# Patient Record
Sex: Female | Born: 1985 | Race: White | Hispanic: No | Marital: Married | Smoking: Never smoker
Health system: Southern US, Community
[De-identification: ages and names within clinical notes are randomized; demographics above are authoritative.]

## PROBLEM LIST (undated history)

## (undated) DIAGNOSIS — Z8619 Personal history of other infectious and parasitic diseases: Secondary | ICD-10-CM

## (undated) HISTORY — DX: Personal history of other infectious and parasitic diseases: Z86.19

---
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
## 2005-09-15 ENCOUNTER — Other Ambulatory Visit: Admission: RE | Admit: 2005-09-15 | Discharge: 2005-09-15 | Payer: Self-pay | Admitting: Obstetrics and Gynecology

## 2006-01-01 ENCOUNTER — Emergency Department (HOSPITAL_COMMUNITY): Admission: EM | Admit: 2006-01-01 | Discharge: 2006-01-01 | Payer: Self-pay | Admitting: Family Medicine

## 2006-03-08 HISTORY — PX: CHOLECYSTECTOMY: SHX55

## 2006-05-14 ENCOUNTER — Inpatient Hospital Stay (HOSPITAL_COMMUNITY): Admission: AD | Admit: 2006-05-14 | Discharge: 2006-05-14 | Payer: Self-pay | Admitting: Obstetrics and Gynecology

## 2006-05-24 ENCOUNTER — Inpatient Hospital Stay (HOSPITAL_COMMUNITY): Admission: AD | Admit: 2006-05-24 | Discharge: 2006-05-26 | Payer: Self-pay | Admitting: Obstetrics and Gynecology

## 2006-08-12 ENCOUNTER — Encounter: Admission: RE | Admit: 2006-08-12 | Discharge: 2006-08-12 | Payer: Self-pay | Admitting: Family Medicine

## 2006-09-22 ENCOUNTER — Encounter (INDEPENDENT_AMBULATORY_CARE_PROVIDER_SITE_OTHER): Payer: Self-pay | Admitting: General Surgery

## 2006-09-22 ENCOUNTER — Ambulatory Visit (HOSPITAL_COMMUNITY): Admission: RE | Admit: 2006-09-22 | Discharge: 2006-09-22 | Payer: Self-pay | Admitting: General Surgery

## 2007-09-06 ENCOUNTER — Other Ambulatory Visit: Admission: RE | Admit: 2007-09-06 | Discharge: 2007-09-06 | Payer: Self-pay | Admitting: Obstetrics and Gynecology

## 2008-07-25 ENCOUNTER — Inpatient Hospital Stay (HOSPITAL_COMMUNITY): Admission: AD | Admit: 2008-07-25 | Discharge: 2008-07-29 | Payer: Self-pay | Admitting: Obstetrics and Gynecology

## 2008-10-28 ENCOUNTER — Other Ambulatory Visit: Admission: RE | Admit: 2008-10-28 | Discharge: 2008-10-28 | Payer: Self-pay | Admitting: Obstetrics and Gynecology

## 2009-11-05 ENCOUNTER — Other Ambulatory Visit: Admission: RE | Admit: 2009-11-05 | Discharge: 2009-11-05 | Payer: Self-pay | Admitting: Obstetrics and Gynecology

## 2009-12-23 ENCOUNTER — Encounter: Admission: RE | Admit: 2009-12-23 | Discharge: 2009-12-23 | Payer: Self-pay | Admitting: Gastroenterology

## 2010-06-16 LAB — COMPREHENSIVE METABOLIC PANEL
ALT: 13 U/L (ref 0–35)
AST: 23 U/L (ref 0–37)
AST: 28 U/L (ref 0–37)
AST: 28 U/L (ref 0–37)
Albumin: 2.1 g/dL — ABNORMAL LOW (ref 3.5–5.2)
Albumin: 2.5 g/dL — ABNORMAL LOW (ref 3.5–5.2)
Alkaline Phosphatase: 100 U/L (ref 39–117)
Alkaline Phosphatase: 105 U/L (ref 39–117)
BUN: 8 mg/dL (ref 6–23)
CO2: 20 mEq/L (ref 19–32)
CO2: 24 mEq/L (ref 19–32)
Calcium: 8.5 mg/dL (ref 8.4–10.5)
Chloride: 106 mEq/L (ref 96–112)
Chloride: 108 mEq/L (ref 96–112)
Creatinine, Ser: 0.37 mg/dL — ABNORMAL LOW (ref 0.4–1.2)
Creatinine, Ser: 0.37 mg/dL — ABNORMAL LOW (ref 0.4–1.2)
Creatinine, Ser: 0.42 mg/dL (ref 0.4–1.2)
GFR calc Af Amer: >60 mL/min (ref >60)
GFR calc Af Amer: >60 mL/min (ref >60)
GFR calc Af Amer: >60 mL/min (ref >60)
GFR calc non Af Amer: >60 mL/min (ref >60)
GFR calc non Af Amer: >60 mL/min (ref >60)
Potassium: 3.8 mEq/L (ref 3.5–5.1)
Potassium: 4.2 mEq/L (ref 3.5–5.1)
Sodium: 137 mEq/L (ref 135–145)
Total Bilirubin: 0.1 mg/dL — ABNORMAL LOW (ref 0.3–1.2)
Total Bilirubin: 0.4 mg/dL (ref 0.3–1.2)
Total Protein: 6.1 g/dL (ref 6.0–8.3)

## 2010-06-16 LAB — CBC
HCT: 31.2 % — ABNORMAL LOW (ref 36.0–46.0)
HCT: 32.2 % — ABNORMAL LOW (ref 36.0–46.0)
HCT: 36.4 % (ref 36.0–46.0)
Hemoglobin: 11.6 g/dL — ABNORMAL LOW (ref 12.0–15.0)
Hemoglobin: 12.7 g/dL (ref 12.0–15.0)
MCHC: 34.7 g/dL (ref 30.0–36.0)
MCHC: 34.9 g/dL (ref 30.0–36.0)
MCHC: 34.9 g/dL (ref 30.0–36.0)
MCHC: 35 g/dL (ref 30.0–36.0)
MCV: 91.2 fL (ref 78.0–100.0)
MCV: 91.2 fL (ref 78.0–100.0)
MCV: 91.7 fL (ref 78.0–100.0)
MCV: 93.4 fL (ref 78.0–100.0)
Platelets: 109 10*3/uL — ABNORMAL LOW (ref 150–400)
Platelets: 71 10*3/uL — ABNORMAL LOW (ref 150–400)
Platelets: 81 10*3/uL — ABNORMAL LOW (ref 150–400)
RBC: 3.34 MIL/uL — ABNORMAL LOW (ref 3.87–5.11)
RBC: 3.58 MIL/uL — ABNORMAL LOW (ref 3.87–5.11)
RBC: 3.77 MIL/uL — ABNORMAL LOW (ref 3.87–5.11)
RBC: 3.99 MIL/uL (ref 3.87–5.11)
RDW: 14.6 % (ref 11.5–15.5)
RDW: 14.7 % (ref 11.5–15.5)
RDW: 15 % (ref 11.5–15.5)
WBC: 10.2 10*3/uL (ref 4.0–10.5)
WBC: 10.3 10*3/uL (ref 4.0–10.5)
WBC: 12 10*3/uL — ABNORMAL HIGH (ref 4.0–10.5)
WBC: 13 10*3/uL — ABNORMAL HIGH (ref 4.0–10.5)
WBC: 14.6 10*3/uL — ABNORMAL HIGH (ref 4.0–10.5)

## 2010-06-16 LAB — PROTIME-INR
INR: 0.9 (ref 0.00–1.49)
Prothrombin Time: 12.4 seconds (ref 11.6–15.2)

## 2010-06-16 LAB — URIC ACID: Uric Acid, Serum: 5.8 mg/dL (ref 2.4–7.0)

## 2010-06-16 LAB — RPR: RPR Ser Ql: NONREACTIVE

## 2010-07-21 NOTE — Op Note (Signed)
NAMEBRAYLI, KLINGBEIL            ACCOUNT NO.:  [id number]   MEDICAL RECORD NO.:  [id number]          PATIENT TYPE:  AMB   LOCATION:  SDS                          FACILITY:  [facility]   PHYSICIAN:  Gabrielle Dare. Janee Morn, M.D.DATE OF BIRTH:  08-02-1985   DATE OF PROCEDURE:  09/22/2006  DATE OF DISCHARGE:                               OPERATIVE REPORT   PREOPERATIVE DIAGNOSIS:  Symptomatic cholelithiasis.   POSTOPERATIVE DIAGNOSIS:  Symptomatic cholelithiasis.   PROCEDURE:  Laparoscopic cholecystectomy with intraoperative  cholangiogram.   SURGEON:  Gabrielle Dare. Janee Morn, M.D.   ASSISTANT:  Boris Sharper.   ANESTHESIA:  General.   HISTORY OF PRESENT ILLNESS:  Ms. Mcneely is a 25 year old white female  who I evaluated in the office for symptomatic cholelithiasis.  She  presents today for elective cholecystectomy.   PROCEDURE IN DETAIL:  Informed consent was obtained.  The patient  received intravenous antibiotics.  She was brought to the operating  room.  General anesthesia was administered.  Her abdomen was prepped and  draped in a sterile fashion.  The infraumbilical region was infiltrated  with 0.25% Marcaine with epinephrine and infraumbilical incision was  made.  Subcutaneous tissues were dissected down revealing the anterior  fascia.  Anterior fascia was divided sharply and the peritoneal cavity  was entered under direct vision without difficulty.  A 0 Vicryl  pursestring suture was placed around the fascial opening and the Hasson  trocar was inserted into the abdomen.  The abdomen was insufflated with  carbon dioxide in a standard fashion.  Under direct vision, an 11-mm  epigastric and two 5-mm lateral ports were placed.  The 0.25% Marcaine  with epinephrine was used at all port sites.  The dome of the  gallbladder was retracted superomedially.  The infundibulum was  retracted inferolaterally.  Dissection began laterally and progressed  medially, easily identifying the cystic  duct and the cystic artery.  This was actually and anterior branch of the cystic artery.  It was  clipped twice proximally and once distally and divided.  Primary  dissection continued until a large window was created between the cystic  duct, the infundibulum and the liver.  Once we had excellent  visualization, a clip was placed on the infundibulocystic duct junction,  a small nick was made in the cystic duct and a red cholangiogram  catheter was inserted.  Intraoperative cholangiogram was obtained, this  demonstrated a good sized common bile duct.  There were no filling  defects and contrast flowed easily into the duodenum.  The cholangiogram  catheter was removed.  Three clips were placed proximally on the cystic  duct and it was divided.  Further dissection revealed a posterior branch  of the cystic artery.  This was dissected out, clipped twice proximally  and divided distally with cautery.  The gallbladder was taken off the  liver bed with Bovie cautery and was placed in an EndoCatch bag and  removed from abdomen via the infraumbilical port site.  The liver bed  was copiously irrigated.  Meticulous hemostasis was insured.  The clips  remained in excellent position.  The irrigation fluid  was evacuated and  it was clear.  The liver bed was rechecked and remained dry.  The Hasson  trocar was removed from the abdomen and the infraumbilical fascia was  closed by tying the 0 Vicryl pursestring suture under direct vision with  care not to trap any intra-abdominal contents.  The remainder of the  ports were removed under direct vision.  The pneumoperitoneum was  released.  All four wounds were copiously irrigated.  The skin was  closed  with a running 4-0 Vicryl subcuticular stitch.  Sponge, needle, and  instrument counts were correct.  Benzoin, Steri-Strips and sterile  dressings were applied.  The patient tolerated the procedure well  without apparent complication, and was taken to the  recovery room in  stable condition.      Gabrielle Dare Janee Morn, M.D.  Electronically Signed     BET/MEDQ  D:  09/22/2006  T:  09/22/2006  Job:  [id number]   cc:   Quita Skye. Artis Flock, M.D.

## 2010-07-24 NOTE — Discharge Summary (Signed)
[name], [name]            ACCOUNT NO.:  [id number]   MEDICAL RECORD NO.:  [id number]          PATIENT TYPE:  INP   LOCATION:  9116                          FACILITY:  [facility]   PHYSICIAN:  Zenaida Niece, M.D.DATE OF BIRTH:  1985/11/07   DATE OF ADMISSION:  05/24/2006  DATE OF DISCHARGE:  05/26/2006                               DISCHARGE SUMMARY   ADMISSION DIAGNOSIS:  Intrauterine pregnancy at 39 weeks with premature  rupture of membranes.   DISCHARGE DIAGNOSIS:  Intrauterine pregnancy at 39 weeks with premature  rupture of membranes.   PROCEDURES:  On March 18 she had a vaginal delivery.   HISTORY AND PHYSICAL:  This is a 25 year old white female gravida 1,  para 0 with an EGA of [redacted] weeks who presents with the complaint of  leaking fluid since approximately 6 o'clock on the day of admission.  She complained of a few contractions.  Prenatal care was uncomplicated.   PRENATAL LABS:  Blood type is A+ with a negative antibody screen, RPR  nonreactive, rubella immune, hepatitis B surface antigen negative,  gonorrhea and chlamydia negative, 1-hour Glucola 96, group B strep is  negative.  First trimester screen is normal and AFP is normal.   PAST MEDICAL HISTORY:  Significant for migraine headaches. Remainder of  history is essentially noncontributory.   PHYSICAL EXAM:  VITAL SIGNS:  She is afebrile with stable vital signs.  ABDOMEN:  Gravid with fundal height of 38 cm on March 3.  Fetal heart  tracing is normal.  PELVIC:  Cervix is 1+, 70, -2, vertex presentation.   HOSPITAL COURSE:  The patient was admitted and started on Pitocin.  She  received a couple doses of Stadol and then epidural.  She progressed in  active labor and reached complete, pushed well. On the evening of March  18 had a vaginal delivery of a viable female infant with Apgars of 08/09  weighed 7 pounds 8 ounces.  There was a nuchal cord x1 which was  reduced.  Placenta delivered spontaneously intact and  was sent for cord  blood collection.  She had left labial and first-degree perineal  lacerations repaired with 3-0 Vicryl with local block and estimated  blood loss was 500 mL.  Postpartum she had no significant complications.  Predelivery hemoglobin 11.8, postdelivery 8.9 and on postpartum day #2  she was felt to be stable enough for discharge home.   DISCHARGE INSTRUCTIONS:  Regular diet, pelvic rest, follow-up in 6  weeks.   DISCHARGE MEDICATIONS:  Over-the-counter ibuprofen as needed and she is  given our discharge pamphlet.     Zenaida Niece, M.D.  Electronically Signed    TDM/MEDQ  D:  05/26/2006  T:  05/26/2006  Job:  [id number]

## 2010-10-30 ENCOUNTER — Inpatient Hospital Stay (INDEPENDENT_AMBULATORY_CARE_PROVIDER_SITE_OTHER)
Admission: RE | Admit: 2010-10-30 | Discharge: 2010-10-30 | Disposition: A | Discharge status: Home / Self Care | Payer: Self-pay | Source: Ambulatory Visit | Attending: Emergency Medicine | Admitting: Emergency Medicine

## 2010-10-30 DIAGNOSIS — T148XXA Other injury of unspecified body region, initial encounter: Secondary | ICD-10-CM

## 2010-10-30 DIAGNOSIS — W460XXA Contact with hypodermic needle, initial encounter: Secondary | ICD-10-CM

## 2010-10-30 LAB — HIV RAPID SCREEN (BLD OR BODY FLD EXPOSURE): Rapid HIV Screen: NONREACTIVE

## 2010-12-21 LAB — CBC
HCT: 34.7 — ABNORMAL LOW
Hemoglobin: 11.5 — ABNORMAL LOW
MCHC: 33.1
MCV: 80.9
RBC: 4.29
WBC: 10.5

## 2011-04-01 ENCOUNTER — Other Ambulatory Visit (HOSPITAL_COMMUNITY)
Admission: RE | Admit: 2011-04-01 | Discharge: 2011-04-01 | Disposition: A | Discharge status: Home / Self Care | Payer: BC Managed Care – PPO | Source: Ambulatory Visit | Attending: Obstetrics and Gynecology | Admitting: Obstetrics and Gynecology

## 2011-04-01 ENCOUNTER — Other Ambulatory Visit: Payer: Self-pay | Admitting: Nurse Practitioner

## 2011-04-01 DIAGNOSIS — Z01419 Encounter for gynecological examination (general) (routine) without abnormal findings: Secondary | ICD-10-CM | POA: Insufficient documentation

## 2011-04-29 ENCOUNTER — Other Ambulatory Visit: Payer: Self-pay | Admitting: Physician Assistant

## 2012-04-04 ENCOUNTER — Other Ambulatory Visit: Payer: Self-pay | Admitting: Nurse Practitioner

## 2012-04-04 ENCOUNTER — Inpatient Hospital Stay (HOSPITAL_COMMUNITY): Admit: 2012-04-04 | Payer: Self-pay

## 2012-04-04 ENCOUNTER — Other Ambulatory Visit (HOSPITAL_COMMUNITY)
Admission: RE | Admit: 2012-04-04 | Payer: No Typology Code available for payment source | Source: Ambulatory Visit | Admitting: Obstetrics and Gynecology

## 2012-04-04 DIAGNOSIS — Z01419 Encounter for gynecological examination (general) (routine) without abnormal findings: Secondary | ICD-10-CM | POA: Insufficient documentation

## 2013-01-11 ENCOUNTER — Other Ambulatory Visit: Payer: Self-pay

## 2013-04-20 ENCOUNTER — Other Ambulatory Visit: Payer: Self-pay | Admitting: Obstetrics and Gynecology

## 2013-04-20 ENCOUNTER — Other Ambulatory Visit (HOSPITAL_COMMUNITY)
Admission: RE | Admit: 2013-04-20 | Discharge: 2013-04-20 | Disposition: A | Discharge status: Home / Self Care | Payer: No Typology Code available for payment source | Source: Ambulatory Visit | Attending: Obstetrics and Gynecology | Admitting: Obstetrics and Gynecology

## 2013-04-20 DIAGNOSIS — Z01419 Encounter for gynecological examination (general) (routine) without abnormal findings: Secondary | ICD-10-CM | POA: Insufficient documentation

## 2016-04-27 ENCOUNTER — Other Ambulatory Visit (HOSPITAL_COMMUNITY)
Admission: RE | Admit: 2016-04-27 | Discharge: 2016-04-27 | Disposition: A | Discharge status: Home / Self Care | Payer: 59 | Source: Ambulatory Visit | Attending: Nurse Practitioner | Admitting: Nurse Practitioner

## 2016-04-27 ENCOUNTER — Other Ambulatory Visit: Payer: Self-pay | Admitting: Nurse Practitioner

## 2016-04-27 DIAGNOSIS — Z1151 Encounter for screening for human papillomavirus (HPV): Secondary | ICD-10-CM | POA: Insufficient documentation

## 2016-04-27 DIAGNOSIS — Z01419 Encounter for gynecological examination (general) (routine) without abnormal findings: Secondary | ICD-10-CM | POA: Insufficient documentation

## 2016-04-29 LAB — CYTOLOGY - PAP
DIAGNOSIS: NEGATIVE
HPV: NOT DETECTED

## 2017-01-07 ENCOUNTER — Ambulatory Visit (INDEPENDENT_AMBULATORY_CARE_PROVIDER_SITE_OTHER): Payer: 59 | Admitting: Physician Assistant

## 2017-01-07 ENCOUNTER — Encounter: Payer: Self-pay | Admitting: Physician Assistant

## 2017-01-07 ENCOUNTER — Ambulatory Visit (INDEPENDENT_AMBULATORY_CARE_PROVIDER_SITE_OTHER): Payer: 59

## 2017-01-07 VITALS — BP 118/80 | HR 91 | Temp 98.7°F | Ht 64.5 in | Wt 205.0 lb

## 2017-01-07 DIAGNOSIS — M79642 Pain in left hand: Secondary | ICD-10-CM

## 2017-01-07 LAB — COMPREHENSIVE METABOLIC PANEL
ALK PHOS: 70 U/L (ref 39–117)
ALT: 19 U/L (ref 0–35)
AST: 14 U/L (ref 0–37)
Albumin: 4.1 g/dL (ref 3.5–5.2)
BILIRUBIN TOTAL: 0.3 mg/dL (ref 0.2–1.2)
BUN: 11 mg/dL (ref 6–23)
CALCIUM: 9.5 mg/dL (ref 8.4–10.5)
CO2: 28 mEq/L (ref 19–32)
Chloride: 104 mEq/L (ref 96–112)
Creatinine, Ser: 0.49 mg/dL (ref 0.40–1.20)
GFR: 156.1 mL/min (ref >60.00)
Glucose, Bld: 113 mg/dL — ABNORMAL HIGH (ref 70–99)
POTASSIUM: 4 meq/L (ref 3.5–5.1)
Sodium: 138 mEq/L (ref 135–145)
TOTAL PROTEIN: 6.9 g/dL (ref 6.0–8.3)

## 2017-01-07 LAB — CBC WITH DIFFERENTIAL/PLATELET
Basophils Absolute: 0 10*3/uL (ref 0.0–0.1)
Basophils Relative: 0.5 % (ref 0.0–3.0)
Eosinophils Absolute: 0.1 10*3/uL (ref 0.0–0.7)
Eosinophils Relative: 1.3 % (ref 0.0–5.0)
HEMATOCRIT: 41 % (ref 36.0–46.0)
Hemoglobin: 13.4 g/dL (ref 12.0–15.0)
LYMPHS ABS: 2.7 10*3/uL (ref 0.7–4.0)
LYMPHS PCT: 29.5 % (ref 12.0–46.0)
MCHC: 32.6 g/dL (ref 30.0–36.0)
MCV: 92.4 fl (ref 78.0–100.0)
MONOS PCT: 6.6 % (ref 3.0–12.0)
Monocytes Absolute: 0.6 10*3/uL (ref 0.1–1.0)
NEUTROS PCT: 62.1 % (ref 43.0–77.0)
Neutro Abs: 5.8 10*3/uL (ref 1.4–7.7)
Platelets: 272 10*3/uL (ref 150.0–400.0)
RBC: 4.43 Mil/uL (ref 3.87–5.11)
RDW: 13.4 % (ref 11.5–15.5)
WBC: 9.3 10*3/uL (ref 4.0–10.5)

## 2017-01-07 LAB — URIC ACID: Uric Acid, Serum: 6.3 mg/dL (ref 2.4–7.0)

## 2017-01-07 LAB — C-REACTIVE PROTEIN: CRP: 1.5 mg/dL (ref 0.5–20.0)

## 2017-01-07 LAB — SEDIMENTATION RATE: Sed Rate: 45 mm/hr — ABNORMAL HIGH (ref 0–20)

## 2017-01-07 MED ORDER — METHYLPREDNISOLONE 4 MG PO TBPK: ORAL_TABLET | ORAL | 0 refills | Status: DC

## 2017-01-07 NOTE — Patient Instructions (Signed)
It was great to meet you!  Take Medrol Dose Pack as prescribed. We will call you with your lab and xray results. Please follow-up if symptoms worsen or persist despite treatment.

## 2017-01-07 NOTE — Progress Notes (Signed)
Elizabeth [name] is a 31 y.o. female here to Establish Care.  I acted as a Neurosurgeon for [employer], PA-C Corky Mull, LPN  History of Present Illness:   Chief Complaint  Patient presents with  . Establish Care    [employer]  . left hand pain  . Edema    Left hand and knuckles started Monday    Acute Concerns: L hand swelling -- on Monday, she noticed that her L hand knuckles on her index and middle finger were swollen and painful. She denies fevers, chills, poor appetite, unintentional weight loss, cough. Does have a maternal grandmother with RA, mom with arthritis. She has not had fevers that she knows of. She is R-handed. Denies any dietary indiscretions.  No LMP recorded. Patient is not currently having periods (Reason: IUD).  Health Maintenance: Weight -- Weight: 205 lb (93 kg)   Depression screen PHQ 2/9 01/07/2017  Decreased Interest 0  Down, Depressed, Hopeless 0  PHQ - 2 Score 0    No flowsheet data found.  Other providers/specialists: Dr. Leroy Libman -- dentist  Last CPE -- 2016  Past Medical History:  Diagnosis Date  . History of chicken pox      Social History   Social History  . Marital status: Married    Spouse name: N/A  . Number of children: N/A  . Years of education: N/A   Occupational History  . Not on file.   Social History Main Topics  . Smoking status: Never Smoker  . Smokeless tobacco: Never Used  . Alcohol use Yes     Comment: drinks occassionally   . Drug use: No  . Sexual activity: Yes    Birth control/ protection: IUD   Other Topics Concern  . Not on file   Social History Narrative   Worked at [employer] for a bit, now transitioning [employer]   She is a CMA    Past Surgical History:  Procedure Laterality Date  . CHOLECYSTECTOMY  2008    Family History  Problem Relation Age of Onset  . High Cholesterol Mother   . Arthritis Maternal Grandmother        rheumatoid  . Asthma Maternal Grandmother    . Depression Maternal Grandmother   . Heart attack Maternal Grandmother   . Heart disease Maternal Grandmother   . High blood pressure Maternal Grandmother   . Hypercholesterolemia Maternal Grandmother   . Stroke Maternal Grandmother   . Heart attack Maternal Grandfather     No Known Allergies   Current Medications:   Current Outpatient Prescriptions:  .  ibuprofen (ADVIL,MOTRIN) 200 MG tablet, Take 600 mg by mouth as needed., Disp: , Rfl:  .  levonorgestrel (MIRENA) 20 MCG/24HR IUD, 1 each by Intrauterine route once. Inserted 2017, needs to be removed 2022., Disp: , Rfl:  .  methylPREDNISolone (MEDROL DOSEPAK) 4 MG TBPK tablet, 6-5-4-3-2-1-off, Disp: 21 tablet, Rfl: 0   Review of Systems:   ROS  Negative unless otherwise specified per HPI.  Vitals:   Vitals:   01/07/17 0937  BP: 118/80  Pulse: 91  Temp: 98.7 F (37.1 C)  TempSrc: Oral  SpO2: 96%  Weight: 205 lb (93 kg)  Height: 5' 4.5" (1.638 m)     Body mass index is 34.64 kg/m.  Physical Exam:   Physical Exam  Constitutional: She appears well-developed. She is cooperative.  Non-toxic appearance. She does not have a sickly appearance. She does not appear ill. No distress.  Cardiovascular: Normal  rate, regular rhythm, S1 normal, S2 normal, normal heart sounds and normal pulses.   Pulses:      Radial pulses are 2+ on the right side, and 2+ on the left side.  No LE edema  Pulmonary/Chest: Effort normal and breath sounds normal.  Musculoskeletal:  L hand with swelling, point tenderness and mild erythema to 1st and 2nd MCP joints, unable to grip 2/2 pain  Neurological: She is alert. She has normal strength. GCS eye subscore is 4. GCS verbal subscore is 5. GCS motor subscore is 6.  Normal sensation to bilateral digits  Skin: Skin is warm, dry and intact.  Psychiatric: She has a normal mood and affect. Her speech is normal and behavior is normal.  Nursing note and vitals reviewed.   CLINICAL DATA: Acute left  hand pain for 5 days. No known injury. Initial encounter.  EXAM: LEFT HAND - COMPLETE 3+ VIEW  COMPARISON: None.  FINDINGS: There is no evidence of fracture, subluxation or dislocation.  The joint spaces are unremarkable.  No evidence of arthropathy or erosive changes.  Question dorsal soft tissue swelling.  IMPRESSION: Question dorsal soft tissue swelling. No bony abnormalities.   Electronically Signed By: Harmon PierJeffrey Hu M.D. On: 01/07/2017 11:02  Assessment and Plan:    [name] was seen today for establish care, left hand pain and edema.  Diagnoses and all orders for this visit:  Left hand pain Reviewed patient with Dr. Helane RimaErica Wallace. Question if antiinflammatory vs idiopathic vs rheumatoid process. Labs ordered. Medrol dose pack per orders. Xray shows swelling but no bony abnormalities. Follow-up if symptoms worsen or persist. Further work-up and treatment based on lab results. -     CBC with Differential/Platelet -     Comprehensive metabolic panel -     C-reactive protein -     Sedimentation rate -     Rheumatoid factor -     Uric acid -     DG Hand Complete Left; Future -     Cyclic citrul peptide antibody, IgG  Other orders -     methylPREDNISolone (MEDROL DOSEPAK) 4 MG TBPK tablet; 6-5-4-3-2-1-off  . Reviewed expectations re: course of current medical issues. . Discussed self-management of symptoms. . Outlined signs and symptoms indicating need for more acute intervention. . Patient verbalized understanding and all questions were answered. . See orders for this visit as documented in the electronic medical record. . Patient received an After-Visit Summary.   CMA or LPN served as scribe during this visit. History, Physical, and Plan performed by medical provider. Documentation and orders reviewed and attested to.   Jarold MottoSamantha Alize Acy, PA-C

## 2017-01-10 ENCOUNTER — Other Ambulatory Visit: Payer: Self-pay | Admitting: Physician Assistant

## 2017-01-10 ENCOUNTER — Encounter: Payer: Self-pay | Admitting: Physician Assistant

## 2017-01-10 DIAGNOSIS — M25542 Pain in joints of left hand: Secondary | ICD-10-CM

## 2017-01-10 DIAGNOSIS — R768 Other specified abnormal immunological findings in serum: Secondary | ICD-10-CM

## 2017-01-10 LAB — CYCLIC CITRUL PEPTIDE ANTIBODY, IGG: Cyclic Citrullin Peptide Ab: <16 UNITS

## 2017-01-10 LAB — RHEUMATOID FACTOR: Rhuematoid fact SerPl-aCnc: 16 IU/mL — ABNORMAL HIGH (ref <14)

## 2017-03-16 DIAGNOSIS — M2550 Pain in unspecified joint: Secondary | ICD-10-CM | POA: Diagnosis not present

## 2017-03-23 ENCOUNTER — Ambulatory Visit (INDEPENDENT_AMBULATORY_CARE_PROVIDER_SITE_OTHER): Payer: 59 | Admitting: Family Medicine

## 2017-03-23 ENCOUNTER — Encounter: Payer: Self-pay | Admitting: Family Medicine

## 2017-03-23 VITALS — BP 118/74 | HR 93 | Temp 98.6°F | Ht 64.5 in | Wt 212.2 lb

## 2017-03-23 DIAGNOSIS — L720 Epidermal cyst: Secondary | ICD-10-CM | POA: Diagnosis not present

## 2017-03-23 DIAGNOSIS — L918 Other hypertrophic disorders of the skin: Secondary | ICD-10-CM | POA: Diagnosis not present

## 2017-03-23 NOTE — Patient Instructions (Signed)
Epidermal Cyst An epidermal cyst is a small, painless lump under your skin. It may be called an epidermal inclusion cyst or an infundibular cyst. The cyst contains a grayish-white, bad-smelling substance (keratin). It is important not to pop epidermal cysts yourself. These cysts are usually harmless (benign), but they can get infected. Symptoms of infection may include:  Redness.  Inflammation.  Tenderness.  Warmth.  Fever.  A grayish-white, bad-smelling substance draining from the cyst.  Pus draining from the cyst.  Follow these instructions at home:  Take over-the-counter and prescription medicines only as told by your doctor.  If you were prescribed an antibiotic, use it as told by your doctor. Do not stop using the antibiotic even if you start to feel better.  Keep the area around your cyst clean and dry.  Wear loose, dry clothing.  Do not try to pop your cyst.  Avoid touching your cyst.  Check your cyst every day for signs of infection.  Keep all follow-up visits as told by your doctor. This is important. How is this prevented?  Wear clean, dry, clothing.  Avoid wearing tight clothing.  Keep your skin clean and dry. Shower or take baths every day.  Wash your body with a benzoyl peroxide wash when you shower or bathe. Contact a health care provider if:  Your cyst has symptoms of infection.  Your condition is not improving or is getting worse.  You have a cyst that looks different from other cysts you have had.  You have a fever. Get help right away if:  Redness spreads from the cyst into the surrounding area. This information is not intended to replace advice given to you by your health care provider. Make sure you discuss any questions you have with your health care provider. Document Released: 04/01/2004 Document Revised: 10/22/2015 Document Reviewed: 12/25/2014 Elsevier Interactive Patient Education  2018 Elsevier Inc.  

## 2017-03-23 NOTE — Progress Notes (Signed)
    Subjective:  Elizabeth [name] is a 32 y.o. female who presents today for same-day appointment with a chief complaint of skin lesions.   HPI:  Skin Lesions, New problem Located around the right eye.  First lesion appeared about a month ago. Over the last couple of weeks, she has noticed more lesions appearing.  No pain.  No drainage.  No fever or chills.  No home treatment tried.  No obvious precipitating events.  ROS: Per HPI   Objective:  Physical Exam: BP 118/74 (BP Location: Left Arm, Patient Position: Sitting, Cuff Size: Normal)   Pulse 93   Temp 98.6 F (37 C) (Oral)   Ht 5' 4.5" (1.638 m)   Wt 212 lb 3.2 oz (96.3 kg)   SpO2 98%   BMI 35.86 kg/m   Skin: -Right eye: Small 1-2 mm in diameter cystic lesions along lower eyelid, nasal corner of eye, and temporal corner of eye.  No surrounding erythema. -Right temple: Small skin tag noted.   Assessment/Plan:  Epidermal cyst Lesion like most consistent with epidermal cyst.  Reassured patient.  No hyperpigmentation, keratinization, or other atypical features.  Advised patient that these would need to be surgically removed.  She deferred referral for this for today.  Discussed warning signs including development of hyperpigmentation, keratinization, or rapidly enlarging lesions.  Follow-up as needed.  Katina Degreealeb M. Jimmey RalphParker, MD 03/23/2017 10:42 AM

## 2017-05-26 DIAGNOSIS — E669 Obesity, unspecified: Secondary | ICD-10-CM | POA: Diagnosis not present

## 2017-05-26 DIAGNOSIS — R6882 Decreased libido: Secondary | ICD-10-CM | POA: Diagnosis not present

## 2017-06-23 DIAGNOSIS — E559 Vitamin D deficiency, unspecified: Secondary | ICD-10-CM | POA: Diagnosis not present

## 2017-07-23 DIAGNOSIS — R509 Fever, unspecified: Secondary | ICD-10-CM | POA: Diagnosis not present

## 2017-07-25 ENCOUNTER — Encounter: Payer: Self-pay | Admitting: Physician Assistant

## 2017-07-25 ENCOUNTER — Telehealth: Payer: Self-pay | Admitting: Physician Assistant

## 2017-07-25 ENCOUNTER — Ambulatory Visit (INDEPENDENT_AMBULATORY_CARE_PROVIDER_SITE_OTHER): Payer: 59 | Admitting: Physician Assistant

## 2017-07-25 VITALS — BP 100/70 | HR 108 | Temp 100.0°F | Ht 64.5 in | Wt 210.4 lb

## 2017-07-25 DIAGNOSIS — R51 Headache: Secondary | ICD-10-CM | POA: Diagnosis not present

## 2017-07-25 DIAGNOSIS — R509 Fever, unspecified: Secondary | ICD-10-CM | POA: Diagnosis not present

## 2017-07-25 DIAGNOSIS — R519 Headache, unspecified: Secondary | ICD-10-CM

## 2017-07-25 LAB — POCT URINALYSIS DIPSTICK
BILIRUBIN UA: NEGATIVE
Blood, UA: NEGATIVE
GLUCOSE UA: NEGATIVE
KETONES UA: NEGATIVE
Leukocytes, UA: NEGATIVE
NITRITE UA: NEGATIVE
PROTEIN UA: NEGATIVE
Urobilinogen, UA: 1 E.U./dL
pH, UA: 5.5 (ref 5.0–8.0)

## 2017-07-25 LAB — COMPREHENSIVE METABOLIC PANEL
ALT: 33 U/L (ref 0–35)
AST: 23 U/L (ref 0–37)
Albumin: 3.9 g/dL (ref 3.5–5.2)
Alkaline Phosphatase: 54 U/L (ref 39–117)
BILIRUBIN TOTAL: 0.2 mg/dL (ref 0.2–1.2)
BUN: 10 mg/dL (ref 6–23)
CALCIUM: 8.9 mg/dL (ref 8.4–10.5)
CHLORIDE: 103 meq/L (ref 96–112)
CO2: 26 meq/L (ref 19–32)
Creatinine, Ser: 0.61 mg/dL (ref 0.40–1.20)
GFR: 120.81 mL/min (ref >60.00)
GLUCOSE: 84 mg/dL (ref 70–99)
Potassium: 3.5 mEq/L (ref 3.5–5.1)
Sodium: 138 mEq/L (ref 135–145)
Total Protein: 7.2 g/dL (ref 6.0–8.3)

## 2017-07-25 LAB — CBC
HEMATOCRIT: 39 % (ref 36.0–46.0)
HEMOGLOBIN: 13.3 g/dL (ref 12.0–15.0)
MCHC: 34.1 g/dL (ref 30.0–36.0)
MCV: 89.1 fl (ref 78.0–100.0)
PLATELETS: 209 10*3/uL (ref 150.0–400.0)
RBC: 4.38 Mil/uL (ref 3.87–5.11)
RDW: 13.5 % (ref 11.5–15.5)
WBC: 3.4 10*3/uL — ABNORMAL LOW (ref 4.0–10.5)

## 2017-07-25 LAB — POCT URINE PREGNANCY: PREG TEST UR: NEGATIVE

## 2017-07-25 MED ORDER — DOXYCYCLINE HYCLATE 100 MG PO TABS: 100.0000 mg | ORAL_TABLET | Freq: Two times a day (BID) | ORAL | 0 refills | Status: DC

## 2017-07-25 MED ORDER — KETOROLAC TROMETHAMINE 60 MG/2ML IM SOLN
60.0000 mg | Freq: Once | INTRAMUSCULAR | Status: AC
  Administered 2017-07-25: 60 mg via INTRAMUSCULAR

## 2017-07-25 NOTE — Patient Instructions (Addendum)
If your symptoms worsen, please let us know. Start the antibiotic today.  Please push fluids, urine should be clear.  We will be in touch with your lab results.

## 2017-07-25 NOTE — Telephone Encounter (Signed)
See note

## 2017-07-25 NOTE — Progress Notes (Addendum)
Elizabeth [name] is a 32 y.o. female here for a new problem.  I acted as a Neurosurgeon for [employer], PA-C Corky Mull, LPN  History of Present Illness:   Chief Complaint  Patient presents with  . Headache    Headache   This is a new problem. Episode onset: Started on Friday around lunch time. The problem occurs constantly. The pain is located in the occipital (whole head) region. The pain quality is not similar to prior headaches. The quality of the pain is described as aching (Pressure always there). The pain is at a severity of 7/10. The pain is moderate. Associated symptoms include back pain (from lying in bed all weekend), eye pain (when movng eyes left to right), a fever (Friday night 101, Saturday again 101, Sunday no fever), neck pain (due to being in bed all weekend) and scalp tenderness. Pertinent negatives include no coughing, dizziness, nausea, photophobia, sinus pressure, sore throat, swollen glands, tinnitus, visual change or vomiting. Associated symptoms comments: Fatigue. Nothing aggravates the symptoms. Treatments tried: Ibuprofen 600 mg every 6 hours none today. The treatment provided mild (Helped with fever but never took headache away) relief. Her past medical history is significant for migraine headaches (when younger).   Worst symptom is headache. 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   No LMP recorded. (Menstrual status: IUD).  No urinary symptoms. Used to get headaches and migraines, but none recently. Denies travel or tick bites. Denies cough, throat pain or ear pain.  She went to the urgent care on Saturday - mono test was negative. CBC and CMP were essentially normal, except for elevated liver enzymes.   Past Medical History:  Diagnosis Date  . History of chicken pox      Social History   Socioeconomic History  . Marital status: Married    Spouse name: Not on file  . Number of children: Not on file  . Years of  education: Not on file  . Highest education level: Not on file  Occupational History  . Not on file  Social Needs  . Financial resource strain: Not on file  . Food insecurity:    Worry: Not on file    Inability: Not on file  . Transportation needs:    Medical: Not on file    Non-medical: Not on file  Tobacco Use  . Smoking status: Never Smoker  . Smokeless tobacco: Never Used  Substance and Sexual Activity  . Alcohol use: Yes    Comment: drinks occassionally   . Drug use: No  . Sexual activity: Yes    Birth control/protection: IUD  Lifestyle  . Physical activity:    Days per week: Not on file    Minutes per session: Not on file  . Stress: Not on file  Relationships  . Social connections:    Talks on phone: Not on file    Gets together: Not on file    Attends religious service: Not on file    Active member of club or organization: Not on file    Attends meetings of clubs or organizations: Not on file    Relationship status: Not on file  . Intimate partner violence:    Fear of current or ex partner: Not on file    Emotionally abused: Not on file    Physically abused: Not on file    Forced sexual activity: Not on file  Other Topics Concern  . Not on file  Social History Narrative   Worked at [employer] for a bit, now transitioning [employer]   She is a CMA    Past Surgical History:  Procedure Laterality Date  . CHOLECYSTECTOMY  2008    Family History  Problem Relation Age of Onset  . High Cholesterol Mother   . Arthritis Maternal Grandmother        rheumatoid  . Asthma Maternal Grandmother   . Depression Maternal Grandmother   . Heart attack Maternal Grandmother   . Heart disease Maternal Grandmother   . High blood pressure Maternal Grandmother   . Hypercholesterolemia Maternal Grandmother   . Stroke Maternal Grandmother   . Heart attack Maternal Grandfather     No Known Allergies  Current Medications:   Current Outpatient  Medications:  .  ibuprofen (ADVIL,MOTRIN) 200 MG tablet, Take 600 mg by mouth every 6 (six) hours as needed., Disp: , Rfl:  .  doxycycline (VIBRA-TABS) 100 MG tablet, Take 1 tablet (100 mg total) by mouth 2 (two) times daily., Disp: 20 tablet, Rfl: 0 .  levonorgestrel (MIRENA) 20 MCG/24HR IUD, 1 each by Intrauterine route once. Inserted 2017, needs to be removed 2022., Disp: , Rfl:    Review of Systems:   Review of Systems  Constitutional: Positive for fever (Friday night 101, Saturday again 101, Sunday no fever).  HENT: Negative for sinus pressure, sore throat and tinnitus.   Eyes: Positive for pain (when movng eyes left to right). Negative for photophobia.  Respiratory: Negative for cough.   Gastrointestinal: Negative for nausea and vomiting.  Musculoskeletal: Positive for back pain (from lying in bed all weekend) and neck pain (due to being in bed all weekend).  Neurological: Positive for headaches. Negative for dizziness.    Vitals:   Vitals:   07/25/17 0851  BP: 100/70  Pulse: (!) 108  Temp: 100 F (37.8 C)  TempSrc: Oral  SpO2: 96%  Weight: 210 lb 6.1 oz (95.4 kg)  Height: 5' 4.5" (1.638 m)     Body mass index is 35.55 kg/m.  Physical Exam:   Physical Exam  Constitutional: She appears well-developed. She is cooperative.  Non-toxic appearance. She does not have a sickly appearance. She does not appear ill. No distress.  HENT:  Head: Normocephalic and atraumatic.  Right Ear: Tympanic membrane, external ear and ear canal normal. Tympanic membrane is not erythematous, not retracted and not bulging.  Left Ear: Tympanic membrane, external ear and ear canal normal. Tympanic membrane is not erythematous, not retracted and not bulging.  Nose: Nose normal. Right sinus exhibits no maxillary sinus tenderness and no frontal sinus tenderness. Left sinus exhibits no maxillary sinus tenderness and no frontal sinus tenderness.  Mouth/Throat: Uvula is midline. No posterior oropharyngeal  edema or posterior oropharyngeal erythema.  Eyes: Conjunctivae and lids are normal.  Neck: Trachea normal.  Cardiovascular: Regular rhythm, S1 normal, S2 normal and normal heart sounds. Tachycardia present.  Pulmonary/Chest: Effort normal and breath sounds normal. She has no decreased breath sounds. She has no wheezes. She has no rhonchi. She has no rales.  Abdominal: Normal appearance. There is no CVA tenderness.  Musculoskeletal:  ROM of neck normal  Lymphadenopathy:    She has no cervical adenopathy.  Neurological: She is alert. She has normal strength. No cranial nerve deficit or sensory deficit. GCS eye subscore is 4. GCS verbal subscore is 5. GCS motor subscore is 6.  Skin: Skin is warm, dry and intact.  Psychiatric: She has a normal mood and affect.  Her speech is normal and behavior is normal.  Nursing note and vitals reviewed.  Results for orders placed or performed in visit on 07/25/17  POCT urine pregnancy  Result Value Ref Range   Preg Test, Ur Negative Negative  POCT urinalysis dipstick  Result Value Ref Range   Color, UA Yellow    Clarity, UA Slightly Cloudy    Glucose, UA Negative    Bilirubin, UA Negative    Ketones, UA Negative    Spec Grav, UA >=1.030 (A) 1.010 - 1.025   Blood, UA Negative    pH, UA 5.5 5.0 - 8.0   Protein, UA Negative    Urobilinogen, UA 1.0 0.2 or 1.0 E.U./dL   Nitrite, UA Negative    Leukocytes, UA Negative Negative   Appearance     Odor       Assessment and Plan:    Lamanda was seen today for headache.  Diagnoses and all orders for this visit:  Fever, unspecified fever cause and Acute nonintractable headache, unspecified headache type Negative urine pregnancy. Neuro exam benign. No red flags on exam. UA shows dehydration but essentially normal otherwise. Discussed with Dr. Helane Rima. Will repeat blood work today (CBC and CMP). Suspect HA and likely viral illness. Treated with Toradol injection in office today. Given ongoing  symptoms and lack of improvement despite supportive care, will empirically treat with Doxycycline at this time. If symptoms worsen or persist despite treatment, recommend follow-up. -     POCT urinalysis dipstick -     CBC -     Comprehensive metabolic panel -     POCT urine pregnancy -     ketorolac (TORADOL) injection 60 mg  Other orders -     doxycycline (VIBRA-TABS) 100 MG tablet; Take 1 tablet (100 mg total) by mouth 2 (two) times daily.    . Reviewed expectations re: course of current medical issues. . Discussed self-management of symptoms. . Outlined signs and symptoms indicating need for more acute intervention. . Patient verbalized understanding and all questions were answered. . See orders for this visit as documented in the electronic medical record. . Patient received an After-Visit Summary.  CMA or LPN served as scribe during this visit. History, Physical, and Plan performed by medical provider. Documentation and orders reviewed and attested to.  Jarold Motto, PA-C

## 2017-07-25 NOTE — Telephone Encounter (Signed)
Elizabeth [name] spoke to pharmacy and was taken care of.

## 2017-07-25 NOTE — Telephone Encounter (Signed)
Copied from CRM [id number]. Topic: Quick Communication - See Telephone Encounter >> Jul 25, 2017 10:08 AM Eston Mould B wrote: CRM for notification. See Telephone encounter for: 07/25/17.  Ben from [employer] is asking if it is ok to switch out the pts doxycycline (VIBRA-TABS) 100 MG tablet    to monohydrate   His contact number is   [phone],  pt is currently at pharmacy waiting.

## 2017-07-29 ENCOUNTER — Telehealth: Payer: Self-pay

## 2017-07-29 NOTE — Telephone Encounter (Signed)
Received [facility] Triage note regarding red, splotchy, itching rash on her feet only that developed on 07/28/2017.  Patient saw Jarold Motto and has been taking doxycycline since Monday, 07/25/2017, and was concerned the rash may have been related to the medication.  Rash localized to feet only.  Rash is flat and not raised.  [facility] advised patient to be seen within 24 hours.  Spoke with Jarold Motto, PA, and she recommended patient come in to be seen for appointment today.  Attempted to contact patient, but patient did not answer.  Left voicemail asking patient to return call to office if she would like to come in for an appointment to address the rash.  Jarold Motto will not be available, but other providers in the office have openings on their schedule and will be able to see her.

## 2017-08-25 DIAGNOSIS — R6882 Decreased libido: Secondary | ICD-10-CM | POA: Diagnosis not present

## 2017-11-10 DIAGNOSIS — E669 Obesity, unspecified: Secondary | ICD-10-CM | POA: Diagnosis not present

## 2017-11-10 DIAGNOSIS — R6882 Decreased libido: Secondary | ICD-10-CM | POA: Diagnosis not present

## 2017-12-20 ENCOUNTER — Ambulatory Visit (INDEPENDENT_AMBULATORY_CARE_PROVIDER_SITE_OTHER): Payer: 59 | Admitting: Physician Assistant

## 2017-12-20 ENCOUNTER — Encounter: Payer: Self-pay | Admitting: Physician Assistant

## 2017-12-20 VITALS — BP 122/84 | HR 78 | Temp 98.0°F | Resp 16 | Ht 64.5 in | Wt 215.6 lb

## 2017-12-20 DIAGNOSIS — R05 Cough: Secondary | ICD-10-CM

## 2017-12-20 DIAGNOSIS — R059 Cough, unspecified: Secondary | ICD-10-CM

## 2017-12-20 MED ORDER — PREDNISONE 20 MG PO TABS: 20.0000 mg | ORAL_TABLET | Freq: Every day | ORAL | 0 refills | Status: DC

## 2017-12-20 MED ORDER — BENZONATATE 100 MG PO CAPS: 100.0000 mg | ORAL_CAPSULE | Freq: Two times a day (BID) | ORAL | 0 refills | Status: DC | PRN

## 2017-12-20 MED ORDER — AMOXICILLIN-POT CLAVULANATE 875-125 MG PO TABS: 1.0000 | ORAL_TABLET | Freq: Two times a day (BID) | ORAL | 0 refills | Status: DC

## 2017-12-20 NOTE — Progress Notes (Signed)
Elizabeth [name] is a 32 y.o. female here for a new problem.  History of Present Illness:   Chief Complaint  Patient presents with  . Cough    mucous, green   . Headache  . Sore Throat    Saturday    HPI   She developed mucousy green cough with sinus pressure and HA over the weekend. Symptoms are getting worse with time. Denies fever. 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 She is eating and drinking okay. She is trying to push fluids. Denies diarrhea or nausea.   Past Medical History:  Diagnosis Date  . History of chicken pox      Social History   Socioeconomic History  . Marital status: Married    Spouse name: Not on file  . Number of children: Not on file  . Years of education: Not on file  . Highest education level: Not on file  Occupational History  . Not on file  Social Needs  . Financial resource strain: Not on file  . Food insecurity:    Worry: Not on file    Inability: Not on file  . Transportation needs:    Medical: Not on file    Non-medical: Not on file  Tobacco Use  . Smoking status: Never Smoker  . Smokeless tobacco: Never Used  Substance and Sexual Activity  . Alcohol use: Yes    Comment: drinks occassionally   . Drug use: No  . Sexual activity: Yes    Birth control/protection: IUD  Lifestyle  . Physical activity:    Days per week: Not on file    Minutes per session: Not on file  . Stress: Not on file  Relationships  . Social connections:    Talks on phone: Not on file    Gets together: Not on file    Attends religious service: Not on file    Active member of club or organization: Not on file    Attends meetings of clubs or organizations: Not on file    Relationship status: Not on file  . Intimate partner violence:    Fear of current or ex partner: Not on file    Emotionally abused: Not on file    Physically abused: Not on file    Forced sexual activity: Not on file  Other Topics Concern  .  Not on file  Social History Narrative   Worked at [employer] for a bit, now transitioning [employer]   She is a [employer]    Past Surgical History:  Procedure Laterality Date  . CHOLECYSTECTOMY  2008    Family History  Problem Relation Age of Onset  . High Cholesterol Mother   . Arthritis Maternal Grandmother        rheumatoid  . Asthma Maternal Grandmother   . Depression Maternal Grandmother   . Heart attack Maternal Grandmother   . Heart disease Maternal Grandmother   . High blood pressure Maternal Grandmother   . Hypercholesterolemia Maternal Grandmother   . Stroke Maternal Grandmother   . Heart attack Maternal Grandfather     No Known Allergies  Current Medications:   Current Outpatient Medications:  .  ibuprofen (ADVIL,MOTRIN) 200 MG tablet, Take 600 mg by mouth every 6 (six) hours as needed., Disp: , Rfl:  .  levonorgestrel (MIRENA) 20 MCG/24HR IUD, 1 each by Intrauterine route once. Inserted 2017, needs to be removed 2022., Disp: , Rfl:  .  TESTOSTERONE COMPOUNDING KIT TD,  Place onto the skin., Disp: , Rfl:  .  amoxicillin-clavulanate (AUGMENTIN) 875-125 MG tablet, Take 1 tablet by mouth 2 (two) times daily., Disp: 20 tablet, Rfl: 0 .  benzonatate (TESSALON) 100 MG capsule, Take 1 capsule (100 mg total) by mouth 2 (two) times daily as needed for cough., Disp: 20 capsule, Rfl: 0 .  predniSONE (DELTASONE) 20 MG tablet, Take 1 tablet (20 mg total) by mouth daily with breakfast., Disp: 7 tablet, Rfl: 0   Review of Systems:   ROS  Negative unless otherwise specified per HPI.  Vitals:   Vitals:   12/20/17 0752  BP: 122/84  Pulse: 78  Resp: 16  Temp: 98 F (36.7 C)  TempSrc: Oral  SpO2: 98%  Weight: 215 lb 9.6 oz (97.8 kg)  Height: 5' 4.5" (1.638 m)     Body mass index is 36.44 kg/m.  Physical Exam:   Physical Exam  Constitutional: She appears well-developed. She is cooperative.  Non-toxic appearance. She does not have a sickly appearance.  She does not appear ill. No distress.  HENT:  Head: Normocephalic and atraumatic.  Right Ear: Tympanic membrane, external ear and ear canal normal. Tympanic membrane is not erythematous, not retracted and not bulging.  Left Ear: Tympanic membrane, external ear and ear canal normal. Tympanic membrane is not erythematous, not retracted and not bulging.  Nose: Mucosal edema and rhinorrhea present. Right sinus exhibits maxillary sinus tenderness. Right sinus exhibits no frontal sinus tenderness. Left sinus exhibits maxillary sinus tenderness. Left sinus exhibits no frontal sinus tenderness.  Mouth/Throat: Uvula is midline and mucous membranes are normal. Posterior oropharyngeal erythema present. No posterior oropharyngeal edema.  Eyes: Conjunctivae and lids are normal.  Neck: Trachea normal.  Cardiovascular: Normal rate, regular rhythm, S1 normal, S2 normal and normal heart sounds.  Pulmonary/Chest: Effort normal and breath sounds normal. She has no decreased breath sounds. She has no wheezes. She has no rhonchi. She has no rales.  Lymphadenopathy:    She has no cervical adenopathy.  Neurological: She is alert.  Skin: Skin is warm, dry and intact.  Psychiatric: She has a normal mood and affect. Her speech is normal and behavior is normal.  Nursing note and vitals reviewed.   Assessment and Plan:    Ellanie was seen today for cough, headache and sore throat.  Diagnoses and all orders for this visit:  Cough  Other orders -     amoxicillin-clavulanate (AUGMENTIN) 875-125 MG tablet; Take 1 tablet by mouth 2 (two) times daily. -     benzonatate (TESSALON) 100 MG capsule; Take 1 capsule (100 mg total) by mouth 2 (two) times daily as needed for cough. -     predniSONE (DELTASONE) 20 MG tablet; Take 1 tablet (20 mg total) by mouth daily with breakfast.   No red flags on exam.  Will initiate Augmentin and oral Prednisone 20 mg daily per orders. She does not like cough syrups so we are also  going to see if tessalon perles are helpful for her. Discussed taking medications as prescribed. Reviewed return precautions including worsening fever, SOB, worsening cough or other concerns. Push fluids and rest. I recommend that patient follow-up if symptoms worsen or persist despite treatment x 7-10 days, sooner if needed.  . Reviewed expectations re: course of current medical issues. . Discussed self-management of symptoms. . Outlined signs and symptoms indicating need for more acute intervention. . Patient verbalized understanding and all questions were answered. . See orders for this visit as documented in the  electronic medical record. . Patient received an After-Visit Summary.  CMA or LPN served as scribe during this visit. History, Physical, and Plan performed by medical provider. The above documentation has been reviewed and is accurate and complete.  Inda Coke, PA-C

## 2017-12-20 NOTE — Patient Instructions (Signed)
It was great to see you!  Use medication as prescribed: Augmentin oral antibiotic, Tessalon perles for your cough (optional), and low dose oral prednisone  Push fluids and get plenty of rest. Please return if you are not improving as expected, or if you have high fevers (>101.5) or difficulty swallowing or worsening productive cough.  Call clinic with questions.  I hope you start feeling better soon!

## 2018-01-13 ENCOUNTER — Encounter: Payer: Self-pay | Admitting: Physician Assistant

## 2018-01-13 ENCOUNTER — Ambulatory Visit (INDEPENDENT_AMBULATORY_CARE_PROVIDER_SITE_OTHER): Payer: 59

## 2018-01-13 ENCOUNTER — Ambulatory Visit (INDEPENDENT_AMBULATORY_CARE_PROVIDER_SITE_OTHER): Payer: 59 | Admitting: Physician Assistant

## 2018-01-13 VITALS — BP 102/70 | HR 97 | Temp 98.9°F | Ht 64.5 in | Wt 214.2 lb

## 2018-01-13 DIAGNOSIS — R05 Cough: Secondary | ICD-10-CM

## 2018-01-13 DIAGNOSIS — R059 Cough, unspecified: Secondary | ICD-10-CM

## 2018-01-13 MED ORDER — BUDESONIDE-FORMOTEROL FUMARATE 160-4.5 MCG/ACT IN AERO: 2.0000 | INHALATION_SPRAY | Freq: Two times a day (BID) | RESPIRATORY_TRACT | 3 refills | Status: DC

## 2018-01-13 MED ORDER — AZITHROMYCIN 250 MG PO TABS: ORAL_TABLET | ORAL | 0 refills | Status: DC

## 2018-01-13 NOTE — Progress Notes (Signed)
Elizabeth [name] is a 32 y.o. female here for a recurrent problem.  I acted as a Education administrator for [employer], PA-C Anselmo Pickler, LPN  History of Present Illness:   Chief Complaint  Patient presents with  . Cough    Cough  This is a recurrent problem. Episode onset: Still having cough x 4 weeks. The problem has been waxing and waning. The problem occurs every few hours. The cough is productive of sputum (green sputum). Associated symptoms include ear congestion, headaches, nasal congestion and postnasal drip. Pertinent negatives include no chills, ear pain, fever, sore throat, shortness of breath or wheezing. Nothing aggravates the symptoms. Risk factors for lung disease include occupational exposure. Treatments tried: Advil, MUcinex DM. The treatment provided no relief. Her past medical history is significant for bronchitis.   First time she tried Mucinex DM was last night. She states that when she took a second dose it may have made her feel "wired."  She works in a pediatric office and thinks that it is possible that she has been exposed to another set of germs. She was seen by [place] in mid-Oct and was treated with Augmentin and oral prednisone.    Past Medical History:  Diagnosis Date  . History of chicken pox      Social History   Socioeconomic History  . Marital status: Married    Spouse name: Not on file  . Number of children: Not on file  . Years of education: Not on file  . Highest education level: Not on file  Occupational History  . Not on file  Social Needs  . Financial resource strain: Not on file  . Food insecurity:    Worry: Not on file    Inability: Not on file  . Transportation needs:    Medical: Not on file    Non-medical: Not on file  Tobacco Use  . Smoking status: Never Smoker  . Smokeless tobacco: Never Used  Substance and Sexual Activity  . Alcohol use: Yes    Comment: drinks occassionally   . Drug use: No  . Sexual activity: Yes    Birth  control/protection: IUD  Lifestyle  . Physical activity:    Days per week: Not on file    Minutes per session: Not on file  . Stress: Not on file  Relationships  . Social connections:    Talks on phone: Not on file    Gets together: Not on file    Attends religious service: Not on file    Active member of club or organization: Not on file    Attends meetings of clubs or organizations: Not on file    Relationship status: Not on file  . Intimate partner violence:    Fear of current or ex partner: Not on file    Emotionally abused: Not on file    Physically abused: Not on file    Forced sexual activity: Not on file  Other Topics Concern  . Not on file  Social History Narrative   Worked at [employer] for a bit, now transitioning [employer]   She is a [place]    Past Surgical History:  Procedure Laterality Date  . CHOLECYSTECTOMY  2008    Family History  Problem Relation Age of Onset  . High Cholesterol Mother   . Arthritis Maternal Grandmother        rheumatoid  . Asthma Maternal Grandmother   . Depression Maternal Grandmother   . Heart attack Maternal Grandmother   .  Heart disease Maternal Grandmother   . High blood pressure Maternal Grandmother   . Hypercholesterolemia Maternal Grandmother   . Stroke Maternal Grandmother   . Heart attack Maternal Grandfather     No Known Allergies  Current Medications:   Current Outpatient Medications:  .  ibuprofen (ADVIL,MOTRIN) 200 MG tablet, Take 600 mg by mouth every 6 (six) hours as needed., Disp: , Rfl:  .  levonorgestrel (MIRENA) 20 MCG/24HR IUD, 1 each by Intrauterine route once. Inserted 2017, needs to be removed 2022., Disp: , Rfl:  .  TESTOSTERONE COMPOUNDING KIT TD, Place onto the skin., Disp: , Rfl:  .  azithromycin (ZITHROMAX) 250 MG tablet, Take two tablets on day 1, then one tablet daily x 4 days, Disp: 6 tablet, Rfl: 0 .  budesonide-formoterol (SYMBICORT) 160-4.5 MCG/ACT inhaler, Inhale 2 puffs  into the lungs 2 (two) times daily., Disp: 1 Inhaler, Rfl: 3   Review of Systems:   Review of Systems  Constitutional: Negative for chills and fever.  HENT: Positive for postnasal drip. Negative for ear pain and sore throat.   Respiratory: Positive for cough. Negative for shortness of breath and wheezing.   Neurological: Positive for headaches.    Vitals:   Vitals:   01/13/18 0749  BP: 102/70  Pulse: 97  Temp: 98.9 F (37.2 C)  TempSrc: Oral  SpO2: 96%  Weight: 214 lb 4 oz (97.2 kg)  Height: 5' 4.5" (1.638 m)     Body mass index is 36.21 kg/m.  Physical Exam:   Physical Exam  Constitutional: She appears well-developed. She is cooperative.  Non-toxic appearance. She does not have a sickly appearance. She does not appear ill. No distress.  HENT:  Head: Normocephalic and atraumatic.  Right Ear: Tympanic membrane, external ear and ear canal normal. Tympanic membrane is not erythematous, not retracted and not bulging.  Left Ear: Tympanic membrane, external ear and ear canal normal. Tympanic membrane is not erythematous, not retracted and not bulging.  Nose: Mucosal edema and rhinorrhea present. Right sinus exhibits no maxillary sinus tenderness and no frontal sinus tenderness. Left sinus exhibits no maxillary sinus tenderness and no frontal sinus tenderness.  Mouth/Throat: Uvula is midline and mucous membranes are normal. Posterior oropharyngeal erythema present. No posterior oropharyngeal edema. Tonsils are 0 on the right. Tonsils are 0 on the left.  Eyes: Conjunctivae and lids are normal.  Neck: Trachea normal.  Cardiovascular: Normal rate, regular rhythm, S1 normal, S2 normal and normal heart sounds.  Pulmonary/Chest: Effort normal. She has decreased breath sounds in the right lower field and the left lower field. She has no wheezes. She has no rhonchi. She has no rales.  Lymphadenopathy:    She has no cervical adenopathy.  Neurological: She is alert.  Skin: Skin is warm,  dry and intact.  Psychiatric: She has a normal mood and affect. Her speech is normal and behavior is normal.  Nursing note and vitals reviewed.  CXR: pending  Assessment and Plan:    [place] was seen today for cough.  Diagnoses and all orders for this visit:  Cough -     DG Chest 2 View; Future  Other orders -     budesonide-formoterol (SYMBICORT) 160-4.5 MCG/ACT inhaler; Inhale 2 puffs into the lungs 2 (two) times daily. -     azithromycin (ZITHROMAX) 250 MG tablet; Take two tablets on day 1, then one tablet daily x 4 days   No red flags on exam. Suspect bronchitis.  Will initiate Azithromycin and Symbicort per  orders. She declined prescription cough syrup or repeating prednisone. Discussed taking medications as prescribed. Reviewed return precautions including worsening fever, SOB, worsening cough or other concerns. Push fluids and rest. I recommend that patient follow-up if symptoms worsen or persist despite treatment x 7-10 days, sooner if needed.  . Reviewed expectations re: course of current medical issues. . Discussed self-management of symptoms. . Outlined signs and symptoms indicating need for more acute intervention. . Patient verbalized understanding and all questions were answered. . See orders for this visit as documented in the electronic medical record. . Patient received an After-Visit Summary.  CMA or LPN served as scribe during this visit. History, Physical, and Plan performed by medical provider. The above documentation has been reviewed and is accurate and complete.  Inda Coke, PA-C

## 2018-01-13 NOTE — Patient Instructions (Addendum)
It was great to see you!  Use medication as prescribed: scheduled symbicort inhaler and azithromycin antibiotic  Continue Mucinex DM. If you find that it is still making you jittery, may need to do just DM (such as Delsym.)  Push fluids and get plenty of rest. Please return if you are not improving as expected, or if you have high fevers (>101.5) or difficulty swallowing or worsening productive cough.  Call clinic with questions.  I hope you start feeling better soon!

## 2018-01-16 ENCOUNTER — Encounter: Payer: Self-pay | Admitting: Physician Assistant

## 2018-01-17 ENCOUNTER — Encounter: Payer: Self-pay | Admitting: Physician Assistant

## 2018-01-17 ENCOUNTER — Ambulatory Visit (INDEPENDENT_AMBULATORY_CARE_PROVIDER_SITE_OTHER): Payer: 59 | Admitting: Physician Assistant

## 2018-01-17 VITALS — BP 114/70 | HR 84 | Temp 99.0°F | Ht 64.5 in | Wt 213.0 lb

## 2018-01-17 DIAGNOSIS — R350 Frequency of micturition: Secondary | ICD-10-CM | POA: Diagnosis not present

## 2018-01-17 DIAGNOSIS — R300 Dysuria: Secondary | ICD-10-CM

## 2018-01-17 LAB — POCT URINALYSIS DIPSTICK
Bilirubin, UA: NEGATIVE
Blood, UA: 2
GLUCOSE UA: NEGATIVE
KETONES UA: NEGATIVE
Leukocytes, UA: NEGATIVE
Nitrite, UA: NEGATIVE
Protein, UA: POSITIVE — AB
SPEC GRAV UA: 1.025 (ref 1.010–1.025)
Urobilinogen, UA: 0.2 E.U./dL
pH, UA: 6 (ref 5.0–8.0)

## 2018-01-17 MED ORDER — FLUCONAZOLE 150 MG PO TABS: ORAL_TABLET | ORAL | 1 refills | Status: DC

## 2018-01-17 MED ORDER — NITROFURANTOIN MONOHYD MACRO 100 MG PO CAPS: 100.0000 mg | ORAL_CAPSULE | Freq: Two times a day (BID) | ORAL | 0 refills | Status: DC

## 2018-01-17 NOTE — Patient Instructions (Signed)
It was great to see you!  Start Macrobid today. Push fluids. We will be in touch regarding your culture if normal or not.  I have also sent in a diflucan tablet should you need it for yeast.  Take care,  Jarold Motto PA-C

## 2018-01-17 NOTE — Progress Notes (Signed)
Elizabeth [name] is a 32 y.o. female here for a new problem.  I acted as a Education administrator for [employer], PA-C Anselmo Pickler, LPN  History of Present Illness:   Chief Complaint  Patient presents with  . Urinary Tract Infection    Urinary Tract Infection   This is a new problem. The current episode started yesterday. The problem has been unchanged. The quality of the pain is described as burning. The pain is at a severity of 10/10. The pain is severe. There has been no fever. She is sexually active. There is no history of pyelonephritis. Associated symptoms include frequency, hematuria, hesitancy and urgency. Pertinent negatives include no chills, discharge, flank pain, nausea or vomiting. Treatments tried: Adult nurse. The treatment provided no relief.   She was actually able to have a urine sample performed at work yesterday and it was positive for blood, leukocytes and nitrites. She typically has blood in her urine when she has UTI's. No history of kidney stone.   Past Medical History:  Diagnosis Date  . History of chicken pox      Social History   Socioeconomic History  . Marital status: Married    Spouse name: Not on file  . Number of children: Not on file  . Years of education: Not on file  . Highest education level: Not on file  Occupational History  . Not on file  Social Needs  . Financial resource strain: Not on file  . Food insecurity:    Worry: Not on file    Inability: Not on file  . Transportation needs:    Medical: Not on file    Non-medical: Not on file  Tobacco Use  . Smoking status: Never Smoker  . Smokeless tobacco: Never Used  Substance and Sexual Activity  . Alcohol use: Yes    Comment: drinks occassionally   . Drug use: No  . Sexual activity: Yes    Birth control/protection: IUD  Lifestyle  . Physical activity:    Days per week: Not on file    Minutes per session: Not on file  . Stress: Not on file  Relationships  . Social connections:     Talks on phone: Not on file    Gets together: Not on file    Attends religious service: Not on file    Active member of club or organization: Not on file    Attends meetings of clubs or organizations: Not on file    Relationship status: Not on file  . Intimate partner violence:    Fear of current or ex partner: Not on file    Emotionally abused: Not on file    Physically abused: Not on file    Forced sexual activity: Not on file  Other Topics Concern  . Not on file  Social History Narrative   Worked at [employer] for a bit, now transitioning [employer]   She is a [place]    Past Surgical History:  Procedure Laterality Date  . CHOLECYSTECTOMY  2008    Family History  Problem Relation Age of Onset  . High Cholesterol Mother   . Arthritis Maternal Grandmother        rheumatoid  . Asthma Maternal Grandmother   . Depression Maternal Grandmother   . Heart attack Maternal Grandmother   . Heart disease Maternal Grandmother   . High blood pressure Maternal Grandmother   . Hypercholesterolemia Maternal Grandmother   . Stroke Maternal Grandmother   . Heart attack  Maternal Grandfather     No Known Allergies  Current Medications:   Current Outpatient Medications:  .  budesonide-formoterol (SYMBICORT) 160-4.5 MCG/ACT inhaler, Inhale 2 puffs into the lungs 2 (two) times daily., Disp: 1 Inhaler, Rfl: 3 .  ibuprofen (ADVIL,MOTRIN) 200 MG tablet, Take 600 mg by mouth every 6 (six) hours as needed., Disp: , Rfl:  .  levonorgestrel (MIRENA) 20 MCG/24HR IUD, 1 each by Intrauterine route once. Inserted 2017, needs to be removed 2022., Disp: , Rfl:  .  TESTOSTERONE COMPOUNDING KIT TD, Place onto the skin., Disp: , Rfl:  .  fluconazole (DIFLUCAN) 150 MG tablet, Take 1 tablet if needed for yeast. May repeat in 1 week if needed., Disp: 1 tablet, Rfl: 1 .  nitrofurantoin, macrocrystal-monohydrate, (MACROBID) 100 MG capsule, Take 1 capsule (100 mg total) by mouth 2 (two) times  daily., Disp: 10 capsule, Rfl: 0   Review of Systems:   Review of Systems  Constitutional: Negative for chills.  Gastrointestinal: Negative for nausea and vomiting.  Genitourinary: Positive for frequency, hematuria, hesitancy and urgency. Negative for flank pain.    Vitals:   Vitals:   01/17/18 0748  BP: 114/70  Pulse: 84  Temp: 99 F (37.2 C)  TempSrc: Oral  SpO2: 98%  Weight: 213 lb (96.6 kg)  Height: 5' 4.5" (1.638 m)     Body mass index is 36 kg/m.  Physical Exam:   Physical Exam  Constitutional: She appears well-developed. She is cooperative.  Non-toxic appearance. She does not have a sickly appearance. She does not appear ill. No distress.  Cardiovascular: Normal rate, regular rhythm, S1 normal, S2 normal, normal heart sounds and normal pulses.  No LE edema  Pulmonary/Chest: Effort normal and breath sounds normal.  Abdominal: Normal appearance and bowel sounds are normal. There is tenderness in the suprapubic area. There is no CVA tenderness.  Neurological: She is alert. GCS eye subscore is 4. GCS verbal subscore is 5. GCS motor subscore is 6.  Skin: Skin is warm, dry and intact.  Psychiatric: She has a normal mood and affect. Her speech is normal and behavior is normal.  Nursing note and vitals reviewed.   Results for orders placed or performed in visit on 07/25/17  CBC  Result Value Ref Range   WBC 3.4 (L) 4.0 - 10.5 K/uL   RBC 4.38 3.87 - 5.11 Mil/uL   Platelets 209.0 150.0 - 400.0 K/uL   Hemoglobin 13.3 12.0 - 15.0 g/dL   HCT 39.0 36.0 - 46.0 %   MCV 89.1 78.0 - 100.0 fl   MCHC 34.1 30.0 - 36.0 g/dL   RDW 13.5 11.5 - 15.5 %  Comprehensive metabolic panel  Result Value Ref Range   Sodium 138 135 - 145 mEq/L   Potassium 3.5 3.5 - 5.1 mEq/L   Chloride 103 96 - 112 mEq/L   CO2 26 19 - 32 mEq/L   Glucose, Bld 84 70 - 99 mg/dL   BUN 10 6 - 23 mg/dL   Creatinine, Ser 0.61 0.40 - 1.20 mg/dL   Total Bilirubin 0.2 0.2 - 1.2 mg/dL   Alkaline Phosphatase 54  39 - 117 U/L   AST 23 0 - 37 U/L   ALT 33 0 - 35 U/L   Total Protein 7.2 6.0 - 8.3 g/dL   Albumin 3.9 3.5 - 5.2 g/dL   Calcium 8.9 8.4 - 10.5 mg/dL   GFR 120.81 >60.00 mL/min  POCT urine pregnancy  Result Value Ref Range   Preg Test,  Ur Negative Negative  POCT urinalysis dipstick  Result Value Ref Range   Color, UA Yellow    Clarity, UA Slightly Cloudy    Glucose, UA Negative    Bilirubin, UA Negative    Ketones, UA Negative    Spec Grav, UA >=1.030 (A) 1.010 - 1.025   Blood, UA Negative    pH, UA 5.5 5.0 - 8.0   Protein, UA Negative    Urobilinogen, UA 1.0 0.2 or 1.0 E.U./dL   Nitrite, UA Negative    Leukocytes, UA Negative Negative   Appearance     Odor      Assessment and Plan:    Gricelda was seen today for urinary tract infection.  Diagnoses and all orders for this visit:  Dysuria and Frequency of urination Suspect early cystitis. States that this is her usual presentation of symptoms. Will send for culture and start Macrobid. Follow-up if symptoms worsen or persist. I also provided her with diflucan should yeast symptoms develop. -     POCT urinalysis dipstick  Other orders -     nitrofurantoin, macrocrystal-monohydrate, (MACROBID) 100 MG capsule; Take 1 capsule (100 mg total) by mouth 2 (two) times daily. -     fluconazole (DIFLUCAN) 150 MG tablet; Take 1 tablet if needed for yeast. May repeat in 1 week if needed.   . Reviewed expectations re: course of current medical issues. . Discussed self-management of symptoms. . Outlined signs and symptoms indicating need for more acute intervention. . Patient verbalized understanding and all questions were answered. . See orders for this visit as documented in the electronic medical record. . Patient received an After-Visit Summary.  CMA or LPN served as scribe during this visit. History, Physical, and Plan performed by medical provider. The above documentation has been reviewed and is accurate and  complete.  Inda Coke, PA-C

## 2018-01-19 LAB — URINE CULTURE
MICRO NUMBER:: 91361407
SPECIMEN QUALITY: ADEQUATE

## 2018-03-03 ENCOUNTER — Ambulatory Visit (INDEPENDENT_AMBULATORY_CARE_PROVIDER_SITE_OTHER): Payer: 59 | Admitting: Family Medicine

## 2018-03-03 ENCOUNTER — Encounter: Payer: Self-pay | Admitting: Family Medicine

## 2018-03-03 VITALS — BP 122/79 | HR 85 | Temp 98.5°F | Ht 64.5 in | Wt 211.0 lb

## 2018-03-03 DIAGNOSIS — J329 Chronic sinusitis, unspecified: Secondary | ICD-10-CM | POA: Diagnosis not present

## 2018-03-03 DIAGNOSIS — R05 Cough: Secondary | ICD-10-CM | POA: Diagnosis not present

## 2018-03-03 DIAGNOSIS — R059 Cough, unspecified: Secondary | ICD-10-CM

## 2018-03-03 MED ORDER — DOXYCYCLINE HYCLATE 100 MG PO TABS: 100.0000 mg | ORAL_TABLET | Freq: Two times a day (BID) | ORAL | 0 refills | Status: DC

## 2018-03-03 MED ORDER — IPRATROPIUM BROMIDE 0.06 % NA SOLN: 2.0000 | Freq: Four times a day (QID) | NASAL | 0 refills | Status: DC

## 2018-03-03 NOTE — Progress Notes (Signed)
   Subjective:  Elizabeth [name] is a 32 y.o. female who presents today for same-day appointment with a chief complaint of cough.   HPI:  Cough, Acute problem Started 4 days ago. Worsened over that time. Associated with headache, sputum production, rhinorrhea, and malaise.  She has tried Mucinex and Symbicort with no significant improvement in her symptoms.  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  No fevers or chills.  No myalgias.  No other treatments tried.  No other obvious alleviating or aggravating factors.  ROS: Per HPI  PMH: She reports that she has never smoked. She has never used smokeless tobacco. She reports current alcohol use. She reports that she does not use drugs.  Objective:  Physical Exam: BP 122/79 (BP Location: Left Arm, Patient Position: Sitting, Cuff Size: Normal)   Pulse 85   Temp 98.5 F (36.9 C) (Oral)   Ht 5' 4.5" (1.638 m)   Wt 211 lb (95.7 kg)   SpO2 97%   BMI 35.66 kg/m   Gen: NAD, resting comfortably HEENT: TMs with clear effusion.  OP erythematous without exudate.  His mucosa erythematous and boggy bilaterally with clear discharge. CV: RRR with no murmurs appreciated Pulm: NWOB, CTAB with no crackles, wheezes, or rhonchi  Assessment/Plan:  Cough/sinusitis Likely secondary to viral URI. No signs of bacterial infection. Start atrovent for rhinorrhea/sinus congestion. Sent in a "pocket prescription" for doxycycline with strict instruction to not start unless symptoms worsen or fail to improve within the next several days. Recommended tylenol and/or motrin as needed for low grade fever and pain. Encouraged good oral hydration. Return precautions reviewed. Follow up as needed.  Katina Degree. Adyline Huberty M. Jimmey RalphParker, MD 03/03/2018 3:26 PM

## 2018-03-03 NOTE — Patient Instructions (Signed)
Start the atrovent.  Start the doxyxycline if your symptoms worsen or do not improve in a few days.  Please stay well hydrated.  You can take tylenol and/or motrin as needed for low grade fever and pain.  Please let me know if your symptoms worsen or fail to improve.  Take care, Dr Jimmey RalphParker

## 2018-04-04 ENCOUNTER — Ambulatory Visit (INDEPENDENT_AMBULATORY_CARE_PROVIDER_SITE_OTHER): Payer: 59 | Admitting: Physician Assistant

## 2018-04-04 ENCOUNTER — Encounter: Payer: Self-pay | Admitting: Physician Assistant

## 2018-04-04 VITALS — BP 116/78 | HR 79 | Temp 98.6°F | Ht 64.5 in | Wt 211.5 lb

## 2018-04-04 DIAGNOSIS — J029 Acute pharyngitis, unspecified: Secondary | ICD-10-CM | POA: Diagnosis not present

## 2018-04-04 LAB — POCT RAPID STREP A (OFFICE): RAPID STREP A SCREEN: NEGATIVE

## 2018-04-04 NOTE — Progress Notes (Signed)
Elizabeth [name] is a 33 y.o. female here for a new problem.  I acted as a Neurosurgeon for [employer], PA-C Corky Mull, LPN  History of Present Illness:   Chief Complaint  Patient presents with  . Sore Throat    Sore Throat   This is a new problem. Episode onset: Started Sunday night. The problem has been unchanged. There has been no fever. The pain is at a severity of 4/10. The pain is mild. Pertinent negatives include no coughing, diarrhea, headaches, swollen glands, trouble swallowing or vomiting. Exposure to: Pt works at a Teacher, music. Treatments tried: Throat Lozenges. The treatment provided no relief.    Past Medical History:  Diagnosis Date  . History of chicken pox      Social History   Socioeconomic History  . Marital status: Married    Spouse name: Not on file  . Number of children: Not on file  . Years of education: Not on file  . Highest education level: Not on file  Occupational History  . Not on file  Social Needs  . Financial resource strain: Not on file  . Food insecurity:    Worry: Not on file    Inability: Not on file  . Transportation needs:    Medical: Not on file    Non-medical: Not on file  Tobacco Use  . Smoking status: Never Smoker  . Smokeless tobacco: Never Used  Substance and Sexual Activity  . Alcohol use: Yes    Comment: drinks occassionally   . Drug use: No  . Sexual activity: Yes    Birth control/protection: I.U.D.  Lifestyle  . Physical activity:    Days per week: Not on file    Minutes per session: Not on file  . Stress: Not on file  Relationships  . Social connections:    Talks on phone: Not on file    Gets together: Not on file    Attends religious service: Not on file    Active member of club or organization: Not on file    Attends meetings of clubs or organizations: Not on file    Relationship status: Not on file  . Intimate partner violence:    Fear of current or ex partner: Not on file   Emotionally abused: Not on file    Physically abused: Not on file    Forced sexual activity: Not on file  Other Topics Concern  . Not on file  Social History Narrative   Worked at [employer] for a bit, now transitioning [employer]   She is a CMA    Past Surgical History:  Procedure Laterality Date  . CHOLECYSTECTOMY  2008    Family History  Problem Relation Age of Onset  . High Cholesterol Mother   . Arthritis Maternal Grandmother        rheumatoid  . Asthma Maternal Grandmother   . Depression Maternal Grandmother   . Heart attack Maternal Grandmother   . Heart disease Maternal Grandmother   . High blood pressure Maternal Grandmother   . Hypercholesterolemia Maternal Grandmother   . Stroke Maternal Grandmother   . Heart attack Maternal Grandfather     Allergies  Allergen Reactions  . Sulfa Antibiotics Hives    Felt like something was in her throat and could not get it out.    Current Medications:   Current Outpatient Medications:  .  ibuprofen (ADVIL,MOTRIN) 200 MG tablet, Take 600 mg by mouth every 6 (six) hours as needed.,  Disp: , Rfl:  .  ipratropium (ATROVENT) 0.06 % nasal spray, Place 2 sprays into both nostrils 4 (four) times daily. (Patient taking differently: Place 2 sprays into both nostrils as needed. ), Disp: 15 mL, Rfl: 0 .  levonorgestrel (MIRENA) 20 MCG/24HR IUD, 1 each by Intrauterine route once. Inserted 2017, needs to be removed 2022., Disp: , Rfl:    Review of Systems:   Review of Systems  HENT: Negative for trouble swallowing.   Respiratory: Negative for cough.   Gastrointestinal: Negative for diarrhea and vomiting.  Neurological: Negative for headaches.    Vitals:   Vitals:   04/04/18 0822  BP: 116/78  Pulse: 79  Temp: 98.6 F (37 C)  TempSrc: Oral  SpO2: 98%  Weight: 211 lb 8 oz (95.9 kg)  Height: 5' 4.5" (1.638 m)     Body mass index is 35.74 kg/m.  Physical Exam:   Physical Exam Vitals signs and nursing  note reviewed.  Constitutional:      General: She is not in acute distress.    Appearance: She is well-developed. She is not ill-appearing or toxic-appearing.  HENT:     Head: Normocephalic and atraumatic.     Right Ear: Tympanic membrane, ear canal and external ear normal. Tympanic membrane is not erythematous, retracted or bulging.     Left Ear: Tympanic membrane, ear canal and external ear normal. Tympanic membrane is not erythematous, retracted or bulging.     Nose: Nose normal.     Right Sinus: No maxillary sinus tenderness or frontal sinus tenderness.     Left Sinus: No maxillary sinus tenderness or frontal sinus tenderness.     Mouth/Throat:     Pharynx: Uvula midline. Posterior oropharyngeal erythema present.     Tonsils: No tonsillar exudate. Swelling: 1+ on the right. 1+ on the left.  Eyes:     General: Lids are normal.     Conjunctiva/sclera: Conjunctivae normal.  Neck:     Trachea: Trachea normal.  Cardiovascular:     Rate and Rhythm: Normal rate and regular rhythm.     Heart sounds: Normal heart sounds, S1 normal and S2 normal.  Pulmonary:     Effort: Pulmonary effort is normal.     Breath sounds: Normal breath sounds. No decreased breath sounds, wheezing, rhonchi or rales.  Lymphadenopathy:     Cervical: No cervical adenopathy.  Skin:    General: Skin is warm and dry.  Neurological:     Mental Status: She is alert.  Psychiatric:        Speech: Speech normal.        Behavior: Behavior normal. Behavior is cooperative.     Results for orders placed or performed in visit on 04/04/18  POCT rapid strep A  Result Value Ref Range   Rapid Strep A Screen Negative Negative    Assessment and Plan:   [name] was seen today for sore throat.  Diagnoses and all orders for this visit:  Sore throat -     POCT rapid strep A   No red flags on exam.  Negative strep test. Discussed tylenol or ibuprofen. Reviewed return precautions including worsening fever, SOB,  worsening cough or other concerns. Push fluids and rest. I recommend that patient follow-up if symptoms worsen or persist despite treatment x 7-10 days, sooner if needed.  . Reviewed expectations re: course of current medical issues. . Discussed self-management of symptoms. . Outlined signs and symptoms indicating need for more acute intervention. . Patient verbalized understanding  and all questions were answered. . See orders for this visit as documented in the electronic medical record. . Patient received an After-Visit Summary.  CMA or LPN served as scribe during this visit. History, Physical, and Plan performed by medical provider. The above documentation has been reviewed and is accurate and complete.  Jarold MottoSamantha Jourdon Zimmerle, PA-C

## 2018-04-04 NOTE — Patient Instructions (Signed)
It was great to see you!  You have a viral upper respiratory infection. Antibiotics are not needed for this.  Viral infections usually take 7-10 days to resolve.  The cough can last a few weeks to go away.   Push fluids and get plenty of rest. Please return if you are not improving as expected, or if you have high fevers (>101.5) or difficulty swallowing or worsening productive cough.  Call clinic with questions.  I hope you start feeling better soon!  

## 2018-04-07 ENCOUNTER — Encounter: Payer: Self-pay | Admitting: Physician Assistant

## 2018-04-07 ENCOUNTER — Ambulatory Visit (INDEPENDENT_AMBULATORY_CARE_PROVIDER_SITE_OTHER): Payer: 59 | Admitting: Physician Assistant

## 2018-04-07 VITALS — BP 100/70 | HR 94 | Temp 99.0°F | Ht 64.5 in | Wt 211.0 lb

## 2018-04-07 DIAGNOSIS — R6889 Other general symptoms and signs: Secondary | ICD-10-CM

## 2018-04-07 LAB — POC INFLUENZA A&B (BINAX/QUICKVUE)
INFLUENZA A, POC: NEGATIVE
INFLUENZA B, POC: NEGATIVE

## 2018-04-07 MED ORDER — AMOXICILLIN-POT CLAVULANATE 875-125 MG PO TABS: 1.0000 | ORAL_TABLET | Freq: Two times a day (BID) | ORAL | 0 refills | Status: DC

## 2018-04-07 NOTE — Patient Instructions (Signed)
It was great to see you!  Use medication as prescribed: augmentin oral antibiotic  May use ibuprofen for your sore throat.  For cough, I love delsym (or store brand equivalent)  Push fluids and get plenty of rest. Please return if you are not improving as expected, or if you have high fevers (>101.5) or difficulty swallowing or worsening productive cough.  Call clinic with questions.  I hope you start feeling better soon!

## 2018-04-07 NOTE — Progress Notes (Signed)
Elizabeth HeckDanielle Guy Francoshley [name] is a 33 y.o. female here for a follow up of a pre-existing problem.  I acted as a Neurosurgeonscribe for [employer]antha Haniah Penny, PA-C Corky Mullonna Orphanos, LPN  History of Present Illness:   Chief Complaint  Patient presents with  . Cough    Cough  This is a new problem. Episode onset: Pt was seen on 1/28 and now cough has worsened. The problem has been gradually worsening. The problem occurs every few hours. The cough is productive of sputum (expectorating yellow/green sputum). Associated symptoms include headaches, nasal congestion (green occassionally), postnasal drip and a sore throat. Pertinent negatives include no chills, ear congestion, ear pain or fever. Exacerbated by: worse at night. Risk factors for lung disease include occupational exposure. She has tried nothing for the symptoms. There is no history of asthma, bronchitis or pneumonia.   Negative strep on 04/04/18.  Past Medical History:  Diagnosis Date  . History of chicken pox      Social History   Socioeconomic History  . Marital status: Married    Spouse name: Not on file  . Number of children: Not on file  . Years of education: Not on file  . Highest education level: Not on file  Occupational History  . Not on file  Social Needs  . Financial resource strain: Not on file  . Food insecurity:    Worry: Not on file    Inability: Not on file  . Transportation needs:    Medical: Not on file    Non-medical: Not on file  Tobacco Use  . Smoking status: Never Smoker  . Smokeless tobacco: Never Used  Substance and Sexual Activity  . Alcohol use: Yes    Comment: drinks occassionally   . Drug use: No  . Sexual activity: Yes    Birth control/protection: I.U.D.  Lifestyle  . Physical activity:    Days per week: Not on file    Minutes per session: Not on file  . Stress: Not on file  Relationships  . Social connections:    Talks on phone: Not on file    Gets together: Not on file    Attends religious service:  Not on file    Active member of club or organization: Not on file    Attends meetings of clubs or organizations: Not on file    Relationship status: Not on file  . Intimate partner violence:    Fear of current or ex partner: Not on file    Emotionally abused: Not on file    Physically abused: Not on file    Forced sexual activity: Not on file  Other Topics Concern  . Not on file  Social History Narrative   Worked at [employer] for a bit, now transitioning [employer]   She is a CMA    Past Surgical History:  Procedure Laterality Date  . CHOLECYSTECTOMY  2008    Family History  Problem Relation Age of Onset  . High Cholesterol Mother   . Arthritis Maternal Grandmother        rheumatoid  . Asthma Maternal Grandmother   . Depression Maternal Grandmother   . Heart attack Maternal Grandmother   . Heart disease Maternal Grandmother   . High blood pressure Maternal Grandmother   . Hypercholesterolemia Maternal Grandmother   . Stroke Maternal Grandmother   . Heart attack Maternal Grandfather     Allergies  Allergen Reactions  . Sulfa Antibiotics Hives    Felt like something was in  her throat and could not get it out.    Current Medications:   Current Outpatient Medications:  .  amoxicillin-clavulanate (AUGMENTIN) 875-125 MG tablet, Take 1 tablet by mouth 2 (two) times daily., Disp: 20 tablet, Rfl: 0 .  ibuprofen (ADVIL,MOTRIN) 200 MG tablet, Take 600 mg by mouth every 6 (six) hours as needed., Disp: , Rfl:  .  ipratropium (ATROVENT) 0.06 % nasal spray, Place 2 sprays into both nostrils 4 (four) times daily. (Patient taking differently: Place 2 sprays into both nostrils as needed. ), Disp: 15 mL, Rfl: 0 .  levonorgestrel (MIRENA) 20 MCG/24HR IUD, 1 each by Intrauterine route once. Inserted 2017, needs to be removed 2022., Disp: , Rfl:    Review of Systems:   Review of Systems  Constitutional: Negative for chills and fever.  HENT: Positive for postnasal  drip and sore throat. Negative for ear pain.   Respiratory: Positive for cough.   Neurological: Positive for headaches.    Vitals:   Vitals:   04/07/18 1055  BP: 100/70  Pulse: 94  Temp: 99 F (37.2 C)  TempSrc: Oral  SpO2: 97%  Weight: 211 lb (95.7 kg)  Height: 5' 4.5" (1.638 m)     Body mass index is 35.66 kg/m.  Physical Exam:   Physical Exam Vitals signs and nursing note reviewed.  Constitutional:      General: She is not in acute distress.    Appearance: She is well-developed. She is not ill-appearing or toxic-appearing.  HENT:     Head: Normocephalic and atraumatic.     Right Ear: Tympanic membrane, ear canal and external ear normal. Tympanic membrane is not erythematous, retracted or bulging.     Left Ear: Tympanic membrane, ear canal and external ear normal. Tympanic membrane is not erythematous, retracted or bulging.     Nose: Mucosal edema, congestion and rhinorrhea present.     Right Sinus: No maxillary sinus tenderness or frontal sinus tenderness.     Left Sinus: No maxillary sinus tenderness or frontal sinus tenderness.     Mouth/Throat:     Pharynx: Uvula midline. Posterior oropharyngeal erythema present.     Tonsils: Tonsillar exudate present. Swelling: 1+ on the right. 1+ on the left.  Eyes:     General: Lids are normal.     Conjunctiva/sclera: Conjunctivae normal.  Neck:     Trachea: Trachea normal.  Cardiovascular:     Rate and Rhythm: Normal rate and regular rhythm.     Heart sounds: Normal heart sounds, S1 normal and S2 normal.  Pulmonary:     Effort: Pulmonary effort is normal.     Breath sounds: Normal breath sounds. No decreased breath sounds, wheezing, rhonchi or rales.  Lymphadenopathy:     Cervical: No cervical adenopathy.  Skin:    General: Skin is warm and dry.  Neurological:     Mental Status: She is alert.  Psychiatric:        Speech: Speech normal.        Behavior: Behavior normal. Behavior is cooperative.     Results for  orders placed or performed in visit on 04/07/18  POC Influenza A&B(BINAX/QUICKVUE)  Result Value Ref Range   Influenza A, POC Negative Negative   Influenza B, POC Negative Negative    Assessment and Plan:   Mathew was seen today for cough.  Diagnoses and all orders for this visit:  Flu-like symptoms -     POC Influenza A&B(BINAX/QUICKVUE)  Other orders -     amoxicillin-clavulanate (  AUGMENTIN) 875-125 MG tablet; Take 1 tablet by mouth 2 (two) times daily.   No red flags on exam.  Flu test negative. Suspect exudative tonsillitis. Will initiate Augmentin per orders. Discussed taking medications as prescribed. Reviewed return precautions including worsening fever, SOB, worsening cough or other concerns. Push fluids and rest. I recommend that patient follow-up if symptoms worsen or persist despite treatment x 7-10 days, sooner if needed.  . Reviewed expectations re: course of current medical issues. . Discussed self-management of symptoms. . Outlined signs and symptoms indicating need for more acute intervention. . Patient verbalized understanding and all questions were answered. . See orders for this visit as documented in the electronic medical record. . Patient received an After-Visit Summary.  CMA or LPN served as scribe during this visit. History, Physical, and Plan performed by medical provider. The above documentation has been reviewed and is accurate and complete.  Jarold Motto, PA-C

## 2019-05-10 DIAGNOSIS — Z01419 Encounter for gynecological examination (general) (routine) without abnormal findings: Secondary | ICD-10-CM | POA: Diagnosis not present

## 2019-05-10 DIAGNOSIS — Z6835 Body mass index (BMI) 35.0-35.9, adult: Secondary | ICD-10-CM | POA: Diagnosis not present

## 2019-05-10 DIAGNOSIS — Z13228 Encounter for screening for other metabolic disorders: Secondary | ICD-10-CM | POA: Diagnosis not present

## 2019-05-10 LAB — RESULTS CONSOLE HPV: CHL HPV: NEGATIVE

## 2019-05-10 LAB — HM PAP SMEAR

## 2019-09-14 ENCOUNTER — Telehealth: Payer: Self-pay

## 2019-09-14 NOTE — Telephone Encounter (Signed)
Please change OV appointment to a [place] appt for tdap vaccination.

## 2019-09-14 NOTE — Telephone Encounter (Signed)
Appt has been changed

## 2019-09-18 ENCOUNTER — Ambulatory Visit: Payer: 59 | Admitting: Physician Assistant

## 2019-09-18 ENCOUNTER — Other Ambulatory Visit: Payer: Self-pay

## 2019-09-18 ENCOUNTER — Ambulatory Visit (INDEPENDENT_AMBULATORY_CARE_PROVIDER_SITE_OTHER): Payer: BC Managed Care – PPO

## 2019-09-18 DIAGNOSIS — Z23 Encounter for immunization: Secondary | ICD-10-CM

## 2019-11-27 DIAGNOSIS — Z23 Encounter for immunization: Secondary | ICD-10-CM | POA: Diagnosis not present

## 2020-01-06 DIAGNOSIS — M25532 Pain in left wrist: Secondary | ICD-10-CM | POA: Diagnosis not present

## 2020-02-20 ENCOUNTER — Ambulatory Visit: Payer: BC Managed Care – PPO | Admitting: Physician Assistant

## 2020-02-24 IMAGING — DX DG CHEST 2V
2 series · 2 of 2 positions shown · non-contrast
Comparison: None.

CLINICAL DATA: Dry cough with intermittent episodes productive
cough. Symptoms for 4 weeks.

EXAM:
CHEST - 2 VIEW

[chest pa]
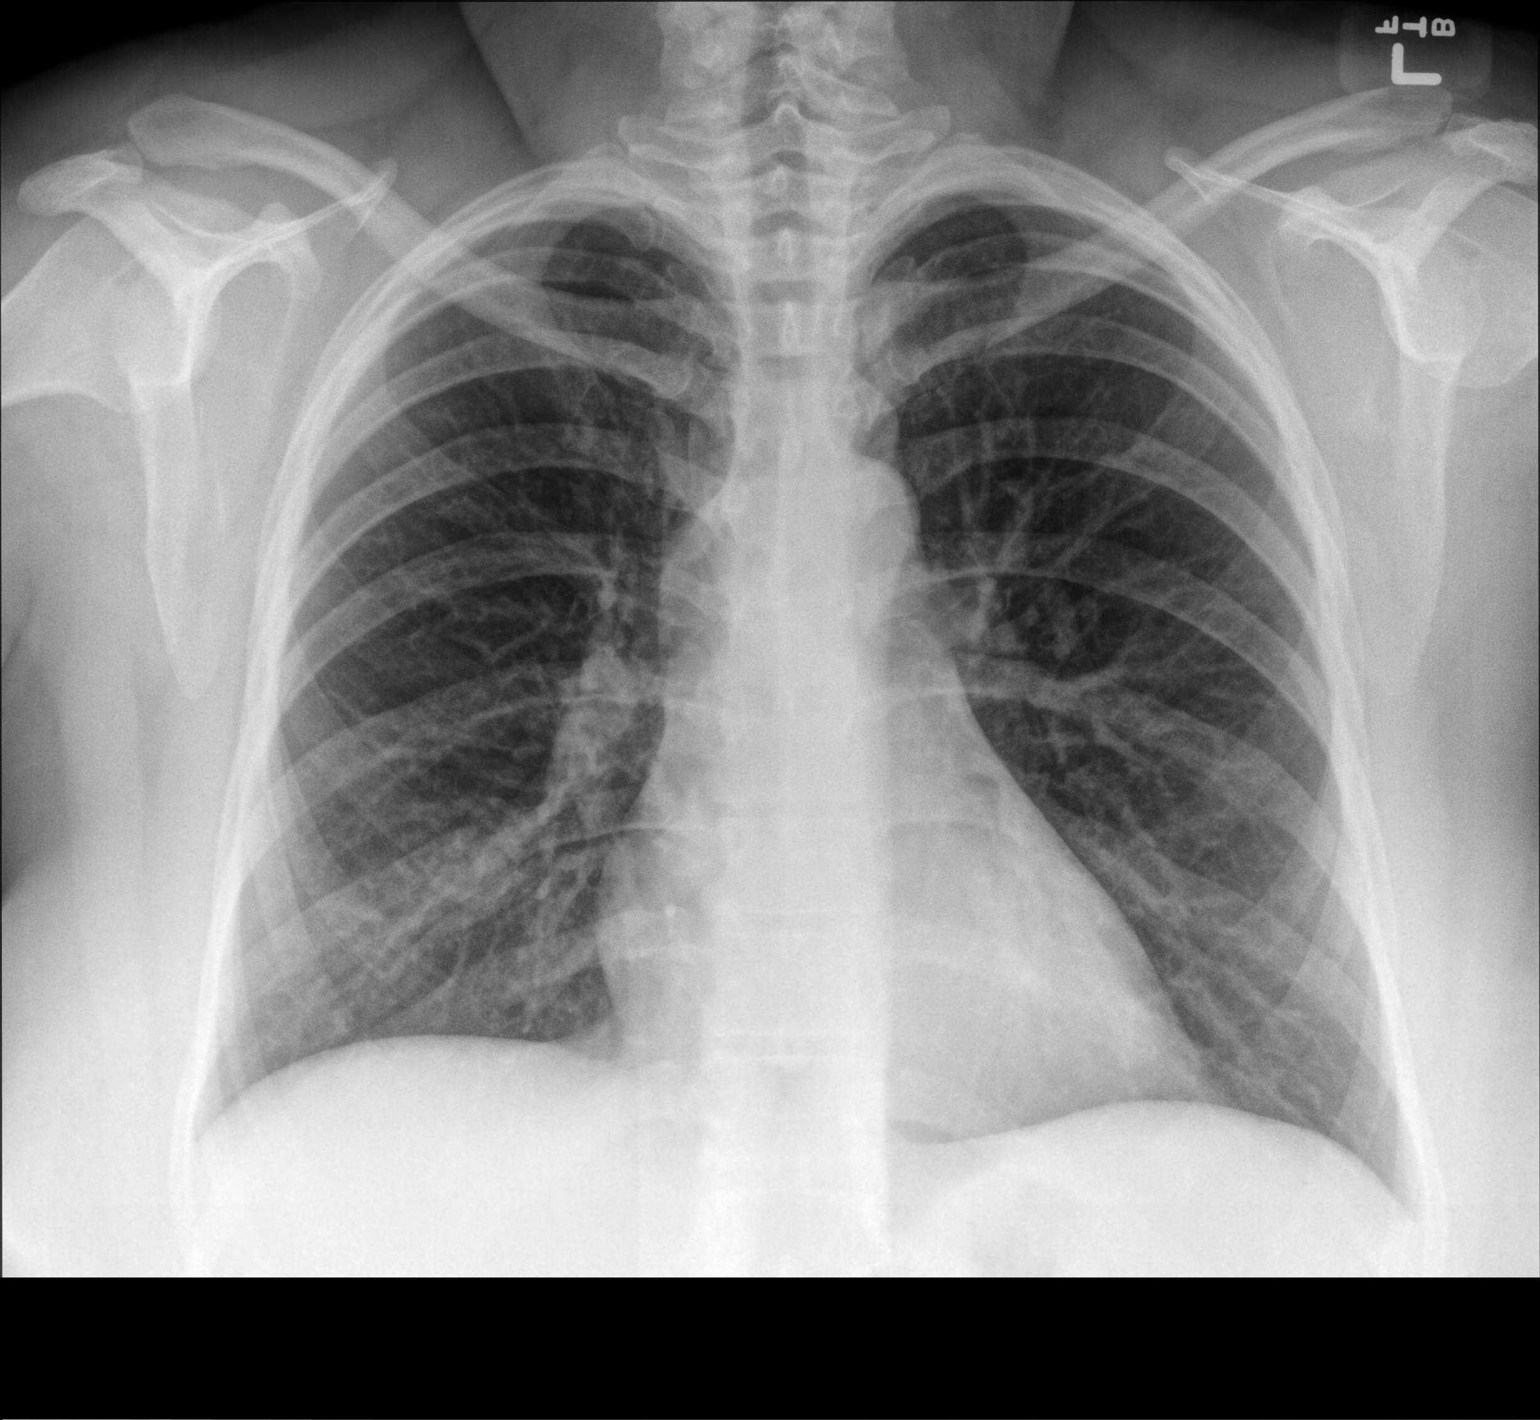

[chest lat]
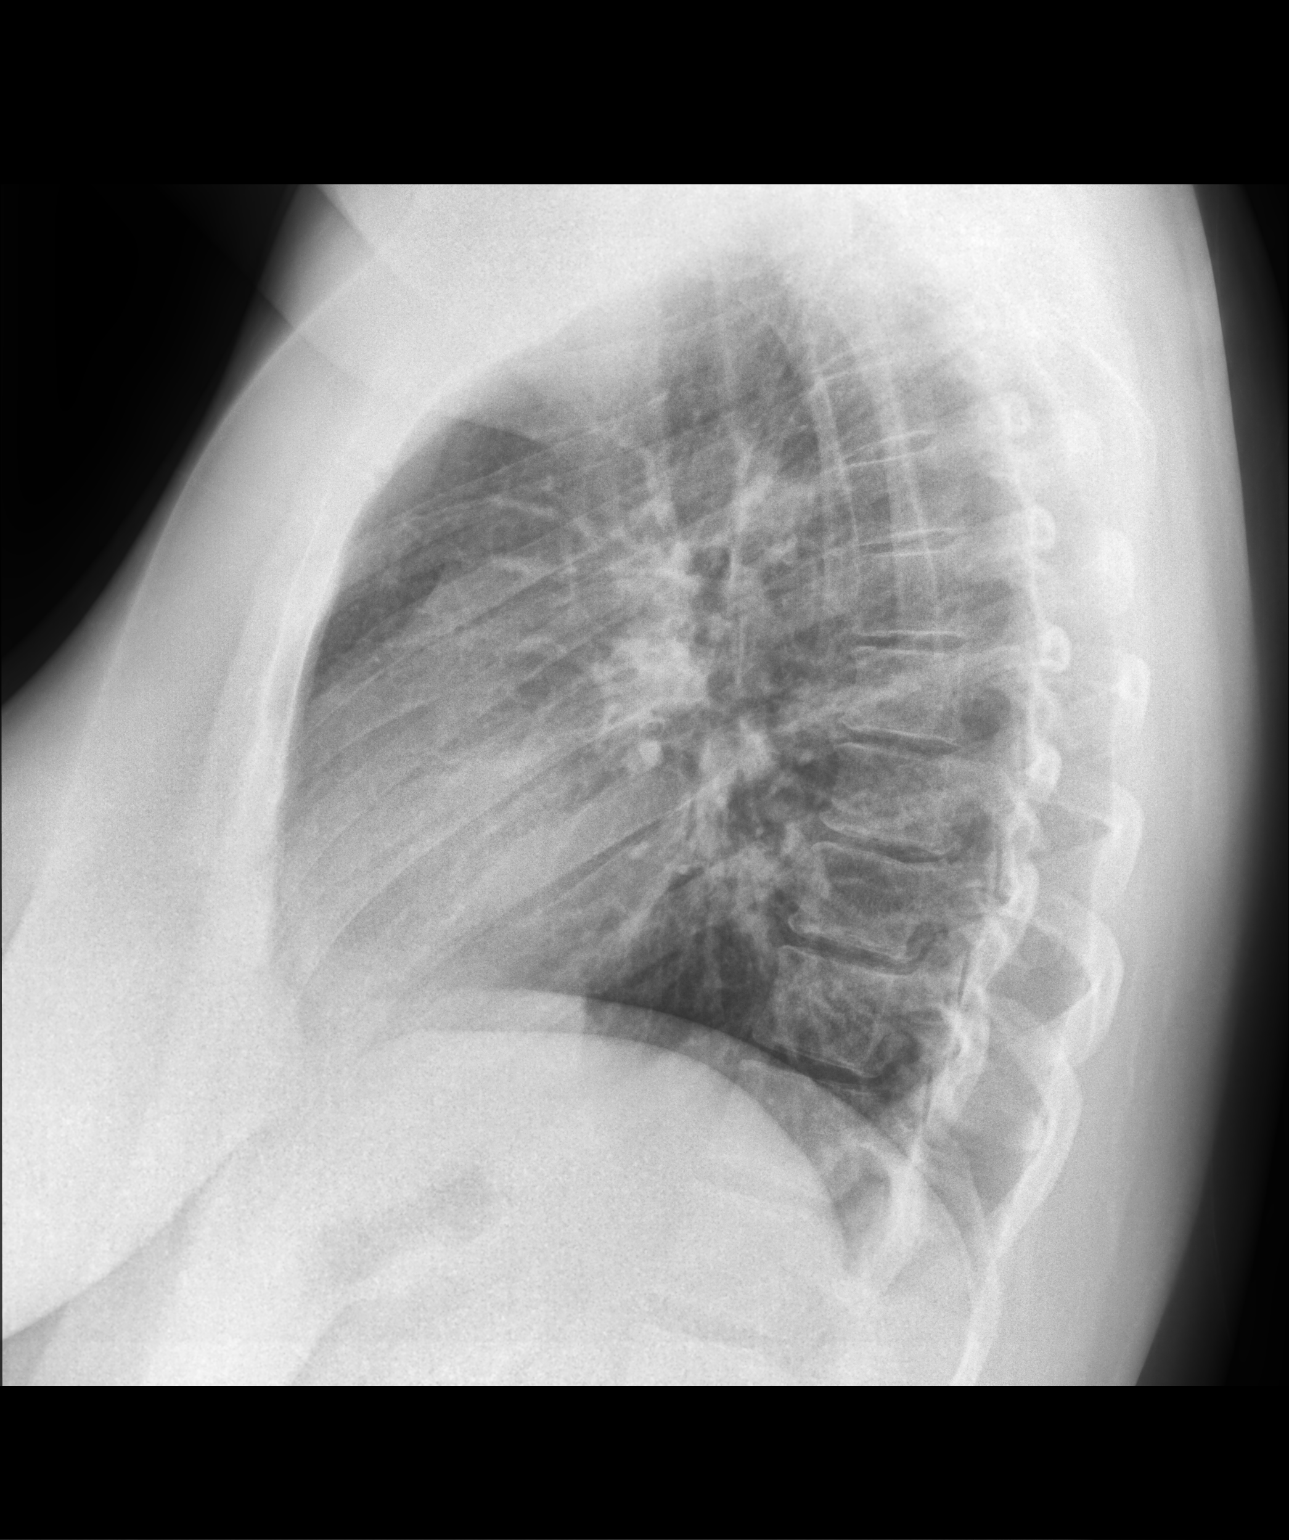

[2 of 2 positions shown; findings below may reference images not displayed]

FINDINGS: The heart size and mediastinal contours are within normal limits.
Both lungs are clear. No pleural effusion or pneumothorax. The
visualized skeletal structures are unremarkable.
IMPRESSION: Normal chest radiographs

## 2020-03-31 ENCOUNTER — Encounter: Payer: Self-pay | Admitting: Physician Assistant

## 2020-03-31 ENCOUNTER — Ambulatory Visit (INDEPENDENT_AMBULATORY_CARE_PROVIDER_SITE_OTHER): Payer: BC Managed Care – PPO | Admitting: Physician Assistant

## 2020-03-31 ENCOUNTER — Other Ambulatory Visit: Payer: Self-pay

## 2020-03-31 VITALS — BP 110/70 | HR 88 | Temp 98.2°F | Ht 65.0 in | Wt 200.0 lb

## 2020-03-31 DIAGNOSIS — Z0000 Encounter for general adult medical examination without abnormal findings: Secondary | ICD-10-CM

## 2020-03-31 DIAGNOSIS — K649 Unspecified hemorrhoids: Secondary | ICD-10-CM | POA: Diagnosis not present

## 2020-03-31 DIAGNOSIS — E669 Obesity, unspecified: Secondary | ICD-10-CM

## 2020-03-31 LAB — CBC WITH DIFFERENTIAL/PLATELET
Basophils Absolute: 0.1 10*3/uL (ref 0.0–0.1)
Basophils Relative: 1 % (ref 0.0–3.0)
Eosinophils Absolute: 0.1 10*3/uL (ref 0.0–0.7)
Eosinophils Relative: 0.9 % (ref 0.0–5.0)
HCT: 41.6 % (ref 36.0–46.0)
Hemoglobin: 14.2 g/dL (ref 12.0–15.0)
Lymphocytes Relative: 24.4 % (ref 12.0–46.0)
Lymphs Abs: 2.6 10*3/uL (ref 0.7–4.0)
MCHC: 34.1 g/dL (ref 30.0–36.0)
MCV: 89.5 fl (ref 78.0–100.0)
Monocytes Absolute: 0.9 10*3/uL (ref 0.1–1.0)
Monocytes Relative: 8 % (ref 3.0–12.0)
Neutro Abs: 7.1 10*3/uL (ref 1.4–7.7)
Neutrophils Relative %: 65.7 % (ref 43.0–77.0)
Platelets: 269 10*3/uL (ref 150.0–400.0)
RBC: 4.65 Mil/uL (ref 3.87–5.11)
RDW: 13.6 % (ref 11.5–15.5)
WBC: 10.8 10*3/uL — ABNORMAL HIGH (ref 4.0–10.5)

## 2020-03-31 LAB — COMPREHENSIVE METABOLIC PANEL
ALT: 34 U/L (ref 0–35)
AST: 27 U/L (ref 0–37)
Albumin: 4.5 g/dL (ref 3.5–5.2)
Alkaline Phosphatase: 67 U/L (ref 39–117)
BUN: 15 mg/dL (ref 6–23)
CO2: 24 mEq/L (ref 19–32)
Calcium: 9.7 mg/dL (ref 8.4–10.5)
Chloride: 104 mEq/L (ref 96–112)
Creatinine, Ser: 0.6 mg/dL (ref 0.40–1.20)
GFR: 116.9 mL/min (ref >60.00)
Glucose, Bld: 81 mg/dL (ref 70–99)
Potassium: 4.2 mEq/L (ref 3.5–5.1)
Sodium: 136 mEq/L (ref 135–145)
Total Bilirubin: 0.4 mg/dL (ref 0.2–1.2)
Total Protein: 7.8 g/dL (ref 6.0–8.3)

## 2020-03-31 LAB — LIPID PANEL
Cholesterol: 169 mg/dL (ref 0–200)
HDL: 39 mg/dL — ABNORMAL LOW (ref >39.00)
LDL Cholesterol: 109 mg/dL — ABNORMAL HIGH (ref 0–99)
NonHDL: 129.98
Total CHOL/HDL Ratio: 4
Triglycerides: 107 mg/dL (ref 0.0–149.0)
VLDL: 21.4 mg/dL (ref 0.0–40.0)

## 2020-03-31 MED ORDER — HYDROCORTISONE ACETATE 1 % EX OINT: 1.0000 mL | TOPICAL_OINTMENT | Freq: Two times a day (BID) | CUTANEOUS | 0 refills | Status: DC | PRN

## 2020-03-31 NOTE — Patient Instructions (Addendum)
It was great to see you!  I have sent in topical anusol (steroid ointment) to use as needed for your hemorrhoids.  Please go to the lab for blood work.   Our office will call you with your results unless you have chosen to receive results via MyChart.  If your blood work is normal we will follow-up each year for physicals and as scheduled for chronic medical problems.  If anything is abnormal we will treat accordingly and get you in for a follow-up.  Take care,  [facility] Maintenance, Female Adopting a healthy lifestyle and getting preventive care are important in promoting health and wellness. Ask your health care provider about:  The right schedule for you to have regular tests and exams.  Things you can do on your own to prevent diseases and keep yourself healthy. What should I know about diet, weight, and exercise? Eat a healthy diet  Eat a diet that includes plenty of vegetables, fruits, low-fat dairy products, and lean protein.  Do not eat a lot of foods that are high in solid fats, added sugars, or sodium.   Maintain a healthy weight Body mass index (BMI) is used to identify weight problems. It estimates body fat based on height and weight. Your health care provider can help determine your BMI and help you achieve or maintain a healthy weight. Get regular exercise Get regular exercise. This is one of the most important things you can do for your health. Most adults should:  Exercise for at least 150 minutes each week. The exercise should increase your heart rate and make you sweat (moderate-intensity exercise).  Do strengthening exercises at least twice a week. This is in addition to the moderate-intensity exercise.  Spend less time sitting. Even light physical activity can be beneficial. Watch cholesterol and blood lipids Have your blood tested for lipids and cholesterol at 35 years of age, then have this test every 5 years. Have your cholesterol levels  checked more often if:  Your lipid or cholesterol levels are high.  You are older than 35 years of age.  You are at high risk for heart disease. What should I know about cancer screening? Depending on your health history and family history, you may need to have cancer screening at various ages. This may include screening for:  Breast cancer.  Cervical cancer.  Colorectal cancer.  Skin cancer.  Lung cancer. What should I know about heart disease, diabetes, and high blood pressure? Blood pressure and heart disease  High blood pressure causes heart disease and increases the risk of stroke. This is more likely to develop in people who have high blood pressure readings, are of African descent, or are overweight.  Have your blood pressure checked: ? Every 3-5 years if you are 3-81 years of age. ? Every year if you are 77 years old or older. Diabetes Have regular diabetes screenings. This checks your fasting blood sugar level. Have the screening done:  Once every three years after age 53 if you are at a normal weight and have a low risk for diabetes.  More often and at a younger age if you are overweight or have a high risk for diabetes. What should I know about preventing infection? Hepatitis B If you have a higher risk for hepatitis B, you should be screened for this virus. Talk with your health care provider to find out if you are at risk for hepatitis B infection. Hepatitis C Testing is recommended for:  Everyone born from [birth year] through 1965.  Anyone with known risk factors for hepatitis C. Sexually transmitted infections (STIs)  Get screened for STIs, including gonorrhea and chlamydia, if: ? You are sexually active and are younger than 35 years of age. ? You are older than 35 years of age and your health care provider tells you that you are at risk for this type of infection. ? Your sexual activity has changed since you were last screened, and you are at increased risk  for chlamydia or gonorrhea. Ask your health care provider if you are at risk.  Ask your health care provider about whether you are at high risk for HIV. Your health care provider may recommend a prescription medicine to help prevent HIV infection. If you choose to take medicine to prevent HIV, you should first get tested for HIV. You should then be tested every 3 months for as long as you are taking the medicine. Pregnancy  If you are about to stop having your period (premenopausal) and you may become pregnant, seek counseling before you get pregnant.  Take 400 to 800 micrograms (mcg) of folic acid every day if you become pregnant.  Ask for birth control (contraception) if you want to prevent pregnancy. Osteoporosis and menopause Osteoporosis is a disease in which the bones lose minerals and strength with aging. This can result in bone fractures. If you are 68 years old or older, or if you are at risk for osteoporosis and fractures, ask your health care provider if you should:  Be screened for bone loss.  Take a calcium or vitamin D supplement to lower your risk of fractures.  Be given hormone replacement therapy (HRT) to treat symptoms of menopause. Follow these instructions at home: Lifestyle  Do not use any products that contain nicotine or tobacco, such as cigarettes, e-cigarettes, and chewing tobacco. If you need help quitting, ask your health care provider.  Do not use street drugs.  Do not share needles.  Ask your health care provider for help if you need support or information about quitting drugs. Alcohol use  Do not drink alcohol if: ? Your health care provider tells you not to drink. ? You are pregnant, may be pregnant, or are planning to become pregnant.  If you drink alcohol: ? Limit how much you use to 0-1 drink a day. ? Limit intake if you are breastfeeding.  Be aware of how much alcohol is in your drink. In the [place] one drink equals one 12 oz bottle of beer (355  mL), one 5 oz glass of wine (148 mL), or one 1 oz glass of hard liquor (44 mL). General instructions  Schedule regular health, dental, and eye exams.  Stay current with your vaccines.  Tell your health care provider if: ? You often feel depressed. ? You have ever been abused or do not feel safe at home. Summary  Adopting a healthy lifestyle and getting preventive care are important in promoting health and wellness.  Follow your health care provider's instructions about healthy diet, exercising, and getting tested or screened for diseases.  Follow your health care provider's instructions on monitoring your cholesterol and blood pressure. This information is not intended to replace advice given to you by your health care provider. Make sure you discuss any questions you have with your health care provider. Document Revised: 02/15/2018 Document Reviewed: 02/15/2018 Elsevier Patient Education  2021 [employer].

## 2020-03-31 NOTE — Progress Notes (Signed)
I acted as a Neurosurgeon for [employer], PA-C Corky Mull, LPN   Subjective:    Elizabeth [name] is a 35 y.o. female and is here for a comprehensive physical exam.  HPI  Health Maintenance Due  Topic Date Due  . PAP SMEAR-Modifier  04/28/2019   Hoping to adopt her niece, needs paperwork completed today.  Acute Concerns: None  Chronic Issues: Hemorrhoids -- has never really dealt with this prior to recently. Takes miralax daily for constipation and tolerates this. Has constipation if she misses this. Denies increased sitting or rectal bleeding.  Health Maintenance: Immunizations -- UTD Colonoscopy -- N/A Mammogram -- N/A PAP -- due, pt will schedule with GYN Bone Density -- N/A Diet -- portion control and less eating out Sleep habits -- no major concerns Exercise -- regular YouTube at home work-out Current Weight -- Weight: 200 lb (90.7 kg)  Weight History: Wt Readings from Last 10 Encounters:  03/31/20 200 lb (90.7 kg)  04/07/18 211 lb (95.7 kg)  04/04/18 211 lb 8 oz (95.9 kg)  03/03/18 211 lb (95.7 kg)  01/17/18 213 lb (96.6 kg)  01/13/18 214 lb 4 oz (97.2 kg)  12/20/17 215 lb 9.6 oz (97.8 kg)  07/25/17 210 lb 6.1 oz (95.4 kg)  03/23/17 212 lb 3.2 oz (96.3 kg)  01/07/17 205 lb (93 kg)   Body mass index is 33.28 kg/m. Mood -- no concenrs No LMP recorded. (Menstrual status: IUD).  Depression screen PHQ 2/9 03/31/2020  Decreased Interest 0  Down, Depressed, Hopeless 0  PHQ - 2 Score 0   Missed December dentist visit -- needs to reschedule Has never seen eye doctor  Other providers/specialists: Patient Care Team: Jarold Motto, [place] as PCP - General (Physician Assistant)   PMHx, SurgHx, SocialHx, Medications, and Allergies were reviewed in the Visit Navigator and updated as appropriate.   Past Medical History:  Diagnosis Date  . History of chicken pox      Past Surgical History:  Procedure Laterality Date  . CHOLECYSTECTOMY  2008      Family History  Problem Relation Age of Onset  . High Cholesterol Mother   . Other Mother        back pain  . Arthritis Maternal Grandmother        rheumatoid  . Asthma Maternal Grandmother   . Depression Maternal Grandmother   . Heart attack Maternal Grandmother   . Heart disease Maternal Grandmother   . High blood pressure Maternal Grandmother   . Hypercholesterolemia Maternal Grandmother   . Stroke Maternal Grandmother   . Heart attack Maternal Grandfather   . Colon cancer Neg Hx   . Breast cancer Neg Hx     Social History   Tobacco Use  . Smoking status: Never Smoker  . Smokeless tobacco: Never Used  Vaping Use  . Vaping Use: Never used  Substance Use Topics  . Alcohol use: Yes    Comment: drinks occassionally   . Drug use: No    Review of Systems:   Review of Systems  Constitutional: Negative for chills, fever, malaise/fatigue and weight loss.  HENT: Negative for hearing loss, sinus pain and sore throat.   Respiratory: Negative for cough and hemoptysis.   Cardiovascular: Negative for chest pain, palpitations, leg swelling and PND.  Gastrointestinal: Negative for abdominal pain, constipation, diarrhea, heartburn, nausea and vomiting.  Genitourinary: Negative for dysuria, frequency and urgency.  Musculoskeletal: Negative for back pain, myalgias and neck pain.  Skin: Negative for itching  and rash.  Neurological: Negative for dizziness, tingling, seizures and headaches.  Endo/Heme/Allergies: Negative for polydipsia.  Psychiatric/Behavioral: Negative for depression. The patient is not nervous/anxious.     Objective:   BP 110/70 (BP Location: Left Arm, Patient Position: Sitting, Cuff Size: Large)   Pulse 88   Temp 98.2 F (36.8 C) (Temporal)   Ht 5\' 5"  (1.651 m)   Wt 200 lb (90.7 kg)   SpO2 98%   BMI 33.28 kg/m   General Appearance:    Alert, cooperative, no distress, appears stated age  Head:    Normocephalic, without obvious abnormality,  atraumatic  Eyes:    PERRL, conjunctiva/corneas clear, EOM's intact, fundi    benign, both eyes  Ears:    Normal TM's and external ear canals, both ears  Nose:   Nares normal, septum midline, mucosa normal, no drainage    or sinus tenderness  Throat:   Lips, mucosa, and tongue normal; teeth and gums normal  Neck:   Supple, symmetrical, trachea midline, no adenopathy;    thyroid:  no enlargement/tenderness/nodules; no carotid   bruit or JVD  Back:     Symmetric, no curvature, ROM normal, no CVA tenderness  Lungs:     Clear to auscultation bilaterally, respirations unlabored  Chest Wall:    No tenderness or deformity   Heart:    Regular rate and rhythm, S1 and S2 normal, no murmur, rub   or gallop  Breast Exam:   Deferred  Abdomen:     Soft, non-tender, bowel sounds active all four quadrants,    no masses, no organomegaly  Genitalia:    Deferred  Rectal:    Deferred  Extremities:   Extremities normal, atraumatic, no cyanosis or edema  Pulses:   2+ and symmetric all extremities  Skin:   Skin color, texture, turgor normal, no rashes or lesions  Lymph nodes:   Cervical, supraclavicular, and axillary nodes normal  Neurologic:   CNII-XII intact, normal strength, sensation and reflexes    throughout     Assessment/Plan:   [name] was seen today for annual exam.  Diagnoses and all orders for this visit:  Routine physical examination Today patient counseled on age appropriate routine health concerns for screening and prevention, each reviewed and up to date or declined. Immunizations reviewed and up to date or declined. Labs ordered and reviewed. Risk factors for depression reviewed and negative. Hearing function and visual acuity are intact. ADLs screened and addressed as needed. Functional ability and level of safety reviewed and appropriate. Education, counseling and referrals performed based on assessed risks today. Patient provided with a copy of personalized plan for preventive  services.  Obesity, unspecified classification, unspecified obesity type, unspecified whether serious comorbidity present Continue healthy diet and exercise. -     CBC with Differential/Platelet -     Comprehensive metabolic panel -     Lipid panel  Hemorrhoids, unspecified hemorrhoid type Not examined. No red flag symptoms. Trial topical anusol, follow-up if inadequate response to treatment or new sx.  Other orders -     Hydrocortisone Acetate 1 % OINT; Apply 1 mL topically 2 (two) times daily as needed.    Well Adult Exam: Labs ordered: Yes. Patient counseling was done. See below for items discussed. Discussed the patient's BMI. The BMI is not in the acceptable range; BMI management plan is completed Follow up as needed for acute illness.  Patient Counseling:   [x]     Nutrition: Stressed importance of moderation in sodium/caffeine intake,  saturated fat and cholesterol, caloric balance, sufficient intake of fresh fruits, vegetables, fiber, calcium, iron, and 1 mg of folate supplement per day (for females capable of pregnancy).   [x]      Stressed the importance of regular exercise.    [x]     Substance Abuse: Discussed cessation/primary prevention of tobacco, alcohol, or other drug use; driving or other dangerous activities under the influence; availability of treatment for abuse.    [x]      Injury prevention: Discussed safety belts, safety helmets, smoke detector, smoking near bedding or upholstery.    [x]      Sexuality: Discussed sexually transmitted diseases, partner selection, use of condoms, avoidance of unintended pregnancy  and contraceptive alternatives.    [x]     Dental health: Discussed importance of regular tooth brushing, flossing, and dental visits.   [x]      Health maintenance and immunizations reviewed. Please refer to Health maintenance section.   CMA or LPN served as scribe during this visit. History, Physical, and Plan performed by medical provider. The  above documentation has been reviewed and is accurate and complete.  , PA-C [facility]

## 2020-08-18 DIAGNOSIS — Z01419 Encounter for gynecological examination (general) (routine) without abnormal findings: Secondary | ICD-10-CM | POA: Diagnosis not present

## 2020-08-20 ENCOUNTER — Telehealth (INDEPENDENT_AMBULATORY_CARE_PROVIDER_SITE_OTHER): Payer: BC Managed Care – PPO | Admitting: Registered Nurse

## 2020-08-20 ENCOUNTER — Other Ambulatory Visit: Payer: Self-pay

## 2020-08-20 ENCOUNTER — Encounter: Payer: Self-pay | Admitting: Registered Nurse

## 2020-08-20 VITALS — Temp 97.6°F | Wt 194.0 lb

## 2020-08-20 DIAGNOSIS — J329 Chronic sinusitis, unspecified: Secondary | ICD-10-CM

## 2020-08-20 DIAGNOSIS — B9689 Other specified bacterial agents as the cause of diseases classified elsewhere: Secondary | ICD-10-CM

## 2020-08-20 MED ORDER — BENZONATATE 200 MG PO CAPS: 200.0000 mg | ORAL_CAPSULE | Freq: Two times a day (BID) | ORAL | 0 refills | Status: DC | PRN

## 2020-08-20 MED ORDER — AZELASTINE HCL 0.1 % NA SOLN: 1.0000 | Freq: Two times a day (BID) | NASAL | 12 refills | Status: DC

## 2020-08-20 MED ORDER — AMOXICILLIN-POT CLAVULANATE 875-125 MG PO TABS: 1.0000 | ORAL_TABLET | Freq: Two times a day (BID) | ORAL | 0 refills | Status: DC

## 2020-08-20 NOTE — Patient Instructions (Signed)
° ° ° °  If you have lab work done today you will be contacted with your lab results within the next 2 weeks.  If you have not heard from us then please contact us. The fastest way to get your results is to register for My Chart. ° ° °IF you received an x-ray today, you will receive an invoice from [facility]. Please contact [facility] at [phone] with questions or concerns regarding your invoice.  ° °IF you received labwork today, you will receive an invoice from [facility]. Please contact [facility] at [phone] with questions or concerns regarding your invoice.  ° °Our billing staff will not be able to assist you with questions regarding bills from these companies. ° °You will be contacted with the lab results as soon as they are available. The fastest way to get your results is to activate your My Chart account. Instructions are located on the last page of this paperwork. If you have not heard from us regarding the results in 2 weeks, please contact this office. °  ° ° ° °

## 2020-08-20 NOTE — Progress Notes (Signed)
Telemedicine Encounter- SOAP NOTE Established Patient  This telephone encounter was conducted with the patient's (or proxy's) verbal consent via audio telecommunications: yes/no: Yes Patient was instructed to have this encounter in a suitably private space; and to only have persons present to whom they give permission to participate. In addition, patient identity was confirmed by use of name plus two identifiers (DOB and address).  I discussed the limitations, risks, security and privacy concerns of performing an evaluation and management service by telephone and the availability of in person appointments. I also discussed with the patient that there may be a patient responsible charge related to this service. The patient expressed understanding and agreed to proceed.  I spent a total of TIME; 0 MIN TO 60 MIN: 15 minutes talking with the patient or their proxy.  Patient at home Provider in office  Participants: Jari Sportsman, NP and Dwana Curd  Chief Complaint  Patient presents with   Sinus Problem    Patient states she thinks she has a sinus infection for about a week. Patient has been coughing, nasal congestion, and a lot of mucus coming out of her mouth. Patient has been using Mucinex but her symptoms are getting worse.    Subjective   Elizabeth [name] is a 35 y.o. established patient. Telephone visit today for congestion  HPI Symptoms onset around one week ago Cold symptoms: coughing, sore throat, itchy throat, pnd As week wore on had congestion and more pnd No lower respiratory symptoms Took COVID test yesterday - negative.   There are no problems to display for this patient.   Past Medical History:  Diagnosis Date   History of chicken pox     Current Outpatient Medications  Medication Sig Dispense Refill   ibuprofen (ADVIL,MOTRIN) 200 MG tablet Take 600 mg by mouth every 6 (six) hours as needed.     ipratropium (ATROVENT) 0.06 % nasal spray Place  2 sprays into both nostrils 4 (four) times daily. (Patient taking differently: Place 2 sprays into both nostrils as needed.) 15 mL 0   levonorgestrel (MIRENA) 20 MCG/24HR IUD 1 each by Intrauterine route once. Inserted 2017, needs to be removed 2022.     Hydrocortisone Acetate 1 % OINT Apply 1 mL topically 2 (two) times daily as needed. (Patient not taking: Reported on 08/20/2020) 28.4 g 0   No current facility-administered medications for this visit.    Allergies  Allergen Reactions   Other Other (See Comments)    Kiwi Throat scratchy and started to close.   Sulfa Antibiotics Hives    Felt like something was in her throat and could not get it out.    Social History   Socioeconomic History   Marital status: Married    Spouse name: Not on file   Number of children: Not on file   Years of education: Not on file   Highest education level: Not on file  Occupational History   Not on file  Tobacco Use   Smoking status: Never   Smokeless tobacco: Never  Vaping Use   Vaping Use: Never used  Substance and Sexual Activity   Alcohol use: Yes    Comment: drinks occassionally    Drug use: No   Sexual activity: Yes    Birth control/protection: I.U.D.  Other Topics Concern   Not on file  Social History Narrative   Worked at [employer] for a bit, now transitioning [employer]   She is a [employer]   Social  Determinants of Health   Financial Resource Strain: Not on file  Food Insecurity: Not on file  Transportation Needs: Not on file  Physical Activity: Not on file  Stress: Not on file  Social Connections: Not on file  Intimate Partner Violence: Not on file    Review of Systems  Constitutional: Negative.   HENT:  Positive for congestion, sinus pain and sore throat.   Eyes: Negative.   Respiratory:  Positive for cough (from pnd). Negative for hemoptysis, sputum production, shortness of breath and wheezing.   Cardiovascular: Negative.   Gastrointestinal: Negative.    Genitourinary: Negative.   Musculoskeletal: Negative.   Skin: Negative.   Neurological: Negative.   Endo/Heme/Allergies: Negative.   Psychiatric/Behavioral: Negative.    All other systems reviewed and are negative.  Objective   Vitals as reported by the patient: Today's Vitals   08/20/20 1114  Temp: 97.6 F (36.4 C)  Weight: 194 lb (88 kg)    There are no diagnoses linked to this encounter.  PLAN Suspect bacterial etiology Augmentin, azelastine, and benzonatate Return if worsening or failing to improve Suggest continuing covid testing at home if symptoms persist Patient encouraged to call clinic with any questions, comments, or concerns.  I discussed the assessment and treatment plan with the patient. The patient was provided an opportunity to ask questions and all were answered. The patient agreed with the plan and demonstrated an understanding of the instructions.   The patient was advised to call back or seek an in-person evaluation if the symptoms worsen or if the condition fails to improve as anticipated.  I provided 15 minutes of non-face-to-face time during this encounter.  Janeece Agee, NP  Primary Care at [facility]

## 2020-11-27 DIAGNOSIS — Z23 Encounter for immunization: Secondary | ICD-10-CM | POA: Diagnosis not present

## 2021-05-04 ENCOUNTER — Other Ambulatory Visit: Payer: Self-pay

## 2021-05-04 ENCOUNTER — Encounter: Payer: Self-pay | Admitting: Physician Assistant

## 2021-05-04 ENCOUNTER — Ambulatory Visit (INDEPENDENT_AMBULATORY_CARE_PROVIDER_SITE_OTHER): Payer: BC Managed Care – PPO | Admitting: Physician Assistant

## 2021-05-04 VITALS — BP 120/78 | HR 72 | Temp 97.9°F | Ht 64.0 in | Wt 201.0 lb

## 2021-05-04 DIAGNOSIS — N926 Irregular menstruation, unspecified: Secondary | ICD-10-CM | POA: Insufficient documentation

## 2021-05-04 DIAGNOSIS — Z0000 Encounter for general adult medical examination without abnormal findings: Secondary | ICD-10-CM | POA: Diagnosis not present

## 2021-05-04 DIAGNOSIS — E785 Hyperlipidemia, unspecified: Secondary | ICD-10-CM | POA: Diagnosis not present

## 2021-05-04 DIAGNOSIS — E669 Obesity, unspecified: Secondary | ICD-10-CM | POA: Diagnosis not present

## 2021-05-04 DIAGNOSIS — F329 Major depressive disorder, single episode, unspecified: Secondary | ICD-10-CM | POA: Insufficient documentation

## 2021-05-04 DIAGNOSIS — G43909 Migraine, unspecified, not intractable, without status migrainosus: Secondary | ICD-10-CM | POA: Insufficient documentation

## 2021-05-04 DIAGNOSIS — N393 Stress incontinence (female) (male): Secondary | ICD-10-CM | POA: Insufficient documentation

## 2021-05-04 NOTE — Patient Instructions (Signed)
It was great to see you! ? ?Please go to the lab for blood work.  ? ?Our office will call you with your results unless you have chosen to receive results via MyChart. ? ?If your blood work is normal we will follow-up each year for physicals and as scheduled for chronic medical problems. ? ?If anything is abnormal we will treat accordingly and get you in for a follow-up. ? ?Take care, ? ?Jemmie Rhinehart ?  ? ? ?

## 2021-05-04 NOTE — Progress Notes (Signed)
Subjective:    Elizabeth [name] is a 36 y.o. female and is here for a comprehensive physical exam.  HPI  Health Maintenance Due  Topic Date Due   COVID-19 Vaccine (4 - Booster for Pfizer series) 02/03/2020   PAP SMEAR-Modifier  04/27/2021   Acute Concerns: None reported at this time.   Chronic Issues: HLD Following routine lab work completed on 03/31/20, pt was found to have elevated cholesterol levels. Pt was not started on medication, instead she has been diet controlled with no issues. Denies CP or SOB.    Health Maintenance: Immunizations -- Covid- UTD Influenza- UTD Tdap- UTD;2021 PAP -- NAT;5573 Bone Density -- N/A Dentistry-UTD Ophthalmology- As needed Diet -- Mainly in-taking water, occasionally tea and soda. Eats all food groups  Sleep habits -- No concerns Exercise -- Walks daily; exercises at the [facility] Current Weight --  Weight History: Wt Readings from Last 10 Encounters:  05/04/21 201 lb (91.2 kg)  08/20/20 194 lb (88 kg)  03/31/20 200 lb (90.7 kg)  04/07/18 211 lb (95.7 kg)  04/04/18 211 lb 8 oz (95.9 kg)  03/03/18 211 lb (95.7 kg)  01/17/18 213 lb (96.6 kg)  01/13/18 214 lb 4 oz (97.2 kg)  12/20/17 215 lb 9.6 oz (97.8 kg)  07/25/17 210 lb 6.1 oz (95.4 kg)   Body mass index is 34.5 kg/m. Mood -- Stable  Patient's last menstrual period was 04/13/2021. Period characteristics -- None Birth control -- Mirena IUD    reports current alcohol use.  Tobacco Use: Low Risk    Smoking Tobacco Use: Never   Smokeless Tobacco Use: Never   Passive Exposure: Not on file     Depression screen [facility] 2/9 05/04/2021  Decreased Interest 0  Down, Depressed, Hopeless 0  PHQ - 2 Score 0     Other providers/specialists: Patient Care Team: Jarold Motto, [place] as PCP - General (Physician Assistant)   PMHx, SurgHx, SocialHx, Medications, and Allergies were reviewed in the Visit Navigator and updated as appropriate.   Past Medical History:  Diagnosis  Date   History of chicken pox      Past Surgical History:  Procedure Laterality Date   CHOLECYSTECTOMY  2008     Family History  Problem Relation Age of Onset   High Cholesterol Mother    Other Mother        back pain   Arthritis Maternal Grandmother        rheumatoid   Asthma Maternal Grandmother    Depression Maternal Grandmother    Heart attack Maternal Grandmother    Heart disease Maternal Grandmother    High blood pressure Maternal Grandmother    Hypercholesterolemia Maternal Grandmother    Stroke Maternal Grandmother    Heart attack Maternal Grandfather    Colon cancer Neg Hx    Breast cancer Neg Hx     Social History   Tobacco Use   Smoking status: Never   Smokeless tobacco: Never  Vaping Use   Vaping Use: Never used  Substance Use Topics   Alcohol use: Yes    Comment: drinks occassionally    Drug use: No    Review of Systems:   Review of Systems  Constitutional:  Negative for chills, fever, malaise/fatigue and weight loss.  HENT:  Negative for hearing loss, sinus pain and sore throat.   Respiratory:  Negative for cough and hemoptysis.   Cardiovascular:  Negative for chest pain, palpitations, leg swelling and PND.  Gastrointestinal:  Negative for abdominal  pain, constipation, diarrhea, heartburn, nausea and vomiting.  Genitourinary:  Negative for dysuria, frequency and urgency.  Musculoskeletal:  Negative for back pain, myalgias and neck pain.  Skin:  Negative for itching and rash.  Neurological:  Negative for dizziness, tingling, seizures and headaches.  Endo/Heme/Allergies:  Negative for polydipsia.  Psychiatric/Behavioral:  Negative for depression. The patient is not nervous/anxious.     Objective:   BP 120/78 (BP Location: Left Arm, Patient Position: Sitting, Cuff Size: Large)    Pulse 72    Temp 97.9 F (36.6 C) (Temporal)    Ht 5\' 4"  (1.626 m)    Wt 201 lb (91.2 kg)    LMP 04/13/2021    SpO2 99%    BMI 34.50 kg/m   General Appearance:     Alert, cooperative, no distress, appears stated age  Head:    Normocephalic, without obvious abnormality, atraumatic  Eyes:    PERRL, conjunctiva/corneas clear, EOM's intact, fundi    benign, both eyes  Ears:    Normal TM's and external ear canals, both ears  Nose:   Nares normal, septum midline, mucosa normal, no drainage    or sinus tenderness  Throat:   Lips, mucosa, and tongue normal; teeth and gums normal  Neck:   Supple, symmetrical, trachea midline, no adenopathy;    thyroid:  no enlargement/tenderness/nodules; no carotid   bruit or JVD  Back:     Symmetric, no curvature, ROM normal, no CVA tenderness  Lungs:     Clear to auscultation bilaterally, respirations unlabored  Chest Wall:    No tenderness or deformity   Heart:    Regular rate and rhythm, S1 and S2 normal, no murmur, rub   or gallop  Breast Exam:    Deferred  Abdomen:     Soft, non-tender, bowel sounds active all four quadrants,    no masses, no organomegaly  Genitalia:    Deferred  Rectal:    Deferred  Extremities:   Extremities normal, atraumatic, no cyanosis or edema  Pulses:   2+ and symmetric all extremities  Skin:   Skin color, texture, turgor normal, no rashes or lesions  Lymph nodes:   Cervical, supraclavicular, and axillary nodes normal  Neurologic:   CNII-XII intact, normal strength, sensation and reflexes    throughout    Assessment/Plan:   Routine physical examination Today patient counseled on age appropriate routine health concerns for screening and prevention, each reviewed and up to date or declined. Immunizations reviewed and up to date or declined. Labs ordered and reviewed. Risk factors for depression reviewed and negative. Hearing function and visual acuity are intact. ADLs screened and addressed as needed. Functional ability and level of safety reviewed and appropriate. Education, counseling and referrals performed based on assessed risks today. Patient provided with a copy of personalized plan for  preventive services.  Obesity, unspecified classification, unspecified obesity type, unspecified whether serious comorbidity present Encouraged patient to continue participating in healthy eating and regular exercise  Discussed possibility of trialing a weight loss medication such as 06/11/2021-- provided patient with handout information  Hyperlipidemia, unspecified hyperlipidemia type Update lipid panel/profile, will start medication as indicated by results      Patient Counseling:   [x]     Nutrition: Stressed importance of moderation in sodium/caffeine intake, saturated fat and cholesterol, caloric balance, sufficient intake of fresh fruits, vegetables, fiber, calcium, iron, and 1 mg of folate supplement per day (for females capable of pregnancy).   [x]      Stressed the importance  of regular exercise.    [x]     Substance Abuse: Discussed cessation/primary prevention of tobacco, alcohol, or other drug use; driving or other dangerous activities under the influence; availability of treatment for abuse.    [x]      Injury prevention: Discussed safety belts, safety helmets, smoke detector, smoking near bedding or upholstery.    [x]      Sexuality: Discussed sexually transmitted diseases, partner selection, use of condoms, avoidance of unintended pregnancy  and contraceptive alternatives.    [x]     Dental health: Discussed importance of regular tooth brushing, flossing, and dental visits.   [x]      Health maintenance and immunizations reviewed. Please refer to Health maintenance section.   I,Havlyn C Ratchford,acting as a for , PA.,have documented all relevant documentation on the behalf of , PA,as directed by  , PA while in the presence of , Neurosurgeon.  I, [employer], Jarold Motto, have reviewed all documentation for this visit. The documentation on 05/04/21 for the exam, diagnosis, procedures, and orders are all accurate and  complete.   Jarold Motto, PA-C [facility]

## 2021-05-25 DIAGNOSIS — N939 Abnormal uterine and vaginal bleeding, unspecified: Secondary | ICD-10-CM | POA: Diagnosis not present

## 2021-05-25 DIAGNOSIS — Z30431 Encounter for routine checking of intrauterine contraceptive device: Secondary | ICD-10-CM | POA: Diagnosis not present

## 2021-08-24 DIAGNOSIS — Z01419 Encounter for gynecological examination (general) (routine) without abnormal findings: Secondary | ICD-10-CM | POA: Diagnosis not present

## 2021-11-24 DIAGNOSIS — Z23 Encounter for immunization: Secondary | ICD-10-CM | POA: Diagnosis not present

## 2021-11-26 ENCOUNTER — Encounter: Payer: Self-pay | Admitting: Physician Assistant

## 2021-11-30 ENCOUNTER — Encounter: Payer: Self-pay | Admitting: *Deleted

## 2021-12-23 DIAGNOSIS — R350 Frequency of micturition: Secondary | ICD-10-CM | POA: Diagnosis not present

## 2021-12-31 ENCOUNTER — Encounter: Payer: Self-pay | Admitting: Physician Assistant

## 2021-12-31 ENCOUNTER — Ambulatory Visit (INDEPENDENT_AMBULATORY_CARE_PROVIDER_SITE_OTHER): Payer: BC Managed Care – PPO | Admitting: Physician Assistant

## 2021-12-31 VITALS — BP 126/80 | HR 83 | Temp 98.4°F | Ht 64.0 in | Wt 210.0 lb

## 2021-12-31 DIAGNOSIS — M546 Pain in thoracic spine: Secondary | ICD-10-CM | POA: Diagnosis not present

## 2021-12-31 MED ORDER — CYCLOBENZAPRINE HCL 5 MG PO TABS: 5.0000 mg | ORAL_TABLET | Freq: Every evening | ORAL | 0 refills | Status: DC | PRN

## 2021-12-31 MED ORDER — MELOXICAM 15 MG PO TABS: 15.0000 mg | ORAL_TABLET | Freq: Every day | ORAL | 0 refills | Status: DC

## 2021-12-31 NOTE — Progress Notes (Signed)
Elizabeth [name] is a 36 y.o. female here for a new problem.  History of Present Illness:   Chief Complaint  Patient presents with   Back Pain    Pt c/o mid back pain started Friday night, she moved a box at work of immunizations. Has been using Lidocaine patches ran out and now using cream, also taking Advil 600 mg every 6 hours. Did heating pad at work the other day. She had a bad night last night.    HPI  Back Pain She complains of lower back pain, which started on Friday 12/25/2021. She notes she picked up boxes at work on Friday. The pain woke up her up at night. She has been taking Advil 600 mg every 6-8 hrs, which helped for a while, but the pain started again last night. She complains that she gets pain when she breathes and notes that she did not want to get up from her bed this morning due to the pain. Her range of motion has been normal. She denies numbness, tingling, and leg pain. She states that two days ago, her pain radiated to her neck but it is better.    Past Medical History:  Diagnosis Date   History of chicken pox      Social History   Tobacco Use   Smoking status: Never   Smokeless tobacco: Never  Vaping Use   Vaping Use: Never used  Substance Use Topics   Alcohol use: Yes    Comment: drinks occassionally    Drug use: No    Past Surgical History:  Procedure Laterality Date   CHOLECYSTECTOMY  2008    Family History  Problem Relation Age of Onset   High Cholesterol Mother    Other Mother        back pain   Arthritis Maternal Grandmother        rheumatoid   Asthma Maternal Grandmother    Depression Maternal Grandmother    Heart attack Maternal Grandmother    Heart disease Maternal Grandmother    High blood pressure Maternal Grandmother    Hypercholesterolemia Maternal Grandmother    Stroke Maternal Grandmother    Heart attack Maternal Grandfather    Colon cancer Neg Hx    Breast cancer Neg Hx     Allergies  Allergen Reactions    Other Other (See Comments)    Kiwi Throat scratchy and started to close.   Sulfa Antibiotics Hives    Felt like something was in her throat and could not get it out.   Kiwi Extract Other (See Comments)    Current Medications:   Current Outpatient Medications:    cyclobenzaprine (FLEXERIL) 5 MG tablet, Take 1 tablet (5 mg total) by mouth at bedtime as needed for muscle spasms., Disp: 30 tablet, Rfl: 0   ibuprofen (ADVIL,MOTRIN) 200 MG tablet, Take 600 mg by mouth every 6 (six) hours as needed., Disp: , Rfl:    levonorgestrel (MIRENA) 20 MCG/24HR IUD, 1 each by Intrauterine route once. Inserted 2017, needs to be removed 2022., Disp: , Rfl:    meloxicam (MOBIC) 15 MG tablet, Take 1 tablet (15 mg total) by mouth daily., Disp: 30 tablet, Rfl: 0   Review of Systems:   Review of Systems  Musculoskeletal:  Positive for back pain.    Vitals:   Vitals:   12/31/21 1124  BP: 126/80  Pulse: 83  Temp: 98.4 F (36.9 C)  TempSrc: Temporal  SpO2: 98%  Weight: 210 lb (95.3 kg)  Height: 5\' 4"  (1.626 m)     Body mass index is 36.05 kg/m.  Physical Exam:   Physical Exam Constitutional:      Appearance: Normal appearance. She is well-developed.  HENT:     Head: Normocephalic and atraumatic.  Eyes:     General: Lids are normal.     Extraocular Movements: Extraocular movements intact.     Conjunctiva/sclera: Conjunctivae normal.  Pulmonary:     Effort: Pulmonary effort is normal.  Musculoskeletal:        General: Normal range of motion.     Cervical back: Normal range of motion and neck supple.     Comments: No decreased ROM 2/2 pain with flexion/extension, lateral side bends, or rotation. Reproducible tenderness with deep palpation to bilateral lumbar paraspinal muscles. No bony tenderness. No evidence of erythema, rash or ecchymosis. Negative STLR bilaterally.   Skin:    General: Skin is warm and dry.  Neurological:     Mental Status: She is alert and oriented to person, place,  and time.  Psychiatric:        Attention and Perception: Attention and perception normal.        Mood and Affect: Mood normal.        Behavior: Behavior normal.        Thought Content: Thought content normal.        Judgment: Judgment normal.     Assessment and Plan:   Acute bilateral thoracic back pain No red flags Suspect mild strain Start daily meloxicam 15 mg daily x 2 weeks, then prn afterwards Start low dose flexeril 10 mg nightly as prescribed -- drowsy precautions discussed Follow-up if new/worsening/lack of improvement -- will refer to sports med vs PT if needed  I,Param Shah,acting as a scribe for , PA.,have documented all relevant documentation on the behalf of [employer], PA,as directed by  Jarold Motto, PA while in the presence of Jarold Motto, Jarold Motto.  I, [place], Jarold Motto, have reviewed all documentation for this visit. The documentation on 12/31/21 for the exam, diagnosis, procedures, and orders are all accurate and complete.   01/02/22, PA-C

## 2022-01-06 ENCOUNTER — Encounter: Payer: Self-pay | Admitting: Physician Assistant

## 2022-01-12 ENCOUNTER — Encounter: Payer: Self-pay | Admitting: Physician Assistant

## 2022-03-19 DIAGNOSIS — R300 Dysuria: Secondary | ICD-10-CM | POA: Diagnosis not present

## 2022-03-29 LAB — COMPREHENSIVE METABOLIC PANEL
Albumin: 3.9 (ref 3.5–5.0)
Calcium: 8.8 (ref 8.7–10.7)
Globulin: 2.7
eGFR: 119

## 2022-03-29 LAB — HEPATIC FUNCTION PANEL
ALT: 29 U/L (ref 7–35)
AST: 16 (ref 13–35)
Alkaline Phosphatase: 84 (ref 25–125)
Bilirubin, Total: 0.3

## 2022-03-29 LAB — BASIC METABOLIC PANEL
BUN: 8 (ref 4–21)
CO2: 20 (ref 13–22)
Chloride: 102 (ref 99–108)
Creatinine: 0.8 (ref 0.5–1.1)
Glucose: 77
Potassium: 4.1 mEq/L (ref 3.5–5.1)
Sodium: 139 (ref 137–147)

## 2022-03-29 LAB — LIPID PANEL
Cholesterol: 166 (ref 0–200)
HDL: 39 (ref 35–70)
LDL Cholesterol: 112
Triglycerides: 76 (ref 40–160)

## 2022-03-29 LAB — IRON,TIBC AND FERRITIN PANEL: Iron: 57

## 2022-04-01 ENCOUNTER — Encounter: Payer: Self-pay | Admitting: Physician Assistant

## 2022-04-01 LAB — URIC ACID: Uric Acid: 5.1

## 2022-08-30 DIAGNOSIS — Z30431 Encounter for routine checking of intrauterine contraceptive device: Secondary | ICD-10-CM | POA: Diagnosis not present

## 2022-08-30 DIAGNOSIS — Z01419 Encounter for gynecological examination (general) (routine) without abnormal findings: Secondary | ICD-10-CM | POA: Diagnosis not present

## 2023-02-22 NOTE — Progress Notes (Signed)
Kaisey Emanee Tarricone is a 37 y.o. female here for a new problem.  History of Present Illness:   No chief complaint on file.   HPI  Elevated Blood Pressure  Blood pressures measured at home are as follows. Reports experiencing episodes of dizziness.    Past Medical History:  Diagnosis Date   History of chicken pox      Social History   Tobacco Use   Smoking status: Never   Smokeless tobacco: Never  Vaping Use   Vaping status: Never Used  Substance Use Topics   Alcohol use: Yes    Comment: drinks occassionally    Drug use: No    Past Surgical History:  Procedure Laterality Date   CHOLECYSTECTOMY  2008    Family History  Problem Relation Age of Onset   High Cholesterol Mother    Other Mother        back pain   Arthritis Maternal Grandmother        rheumatoid   Asthma Maternal Grandmother    Depression Maternal Grandmother    Heart attack Maternal Grandmother    Heart disease Maternal Grandmother    High blood pressure Maternal Grandmother    Hypercholesterolemia Maternal Grandmother    Stroke Maternal Grandmother    Heart attack Maternal Grandfather    Colon cancer Neg Hx    Breast cancer Neg Hx     Allergies  Allergen Reactions   Other Other (See Comments)    Kiwi Throat scratchy and started to close.   Sulfa Antibiotics Hives    Felt like something was in her throat and could not get it out.   Kiwi Extract Other (See Comments)    Current Medications:   Current Outpatient Medications:    cyclobenzaprine (FLEXERIL) 5 MG tablet, Take 1 tablet (5 mg total) by mouth at bedtime as needed for muscle spasms., Disp: 30 tablet, Rfl: 0   ibuprofen (ADVIL,MOTRIN) 200 MG tablet, Take 600 mg by mouth every 6 (six) hours as needed., Disp: , Rfl:    levonorgestrel (MIRENA) 20 MCG/24HR IUD, 1 each by Intrauterine route once. Inserted 2017, needs to be removed 2022., Disp: , Rfl:    meloxicam (MOBIC) 15 MG tablet, Take 1 tablet (15 mg total) by mouth daily.,  Disp: 30 tablet, Rfl: 0   Review of Systems:   ROS  Vitals:   There were no vitals filed for this visit.   There is no height or weight on file to calculate BMI.  Physical Exam:   Physical Exam  Assessment and Plan:   ***   I,Alexander Ruley,acting as a scribe for Jarold Motto, PA.,have documented all relevant documentation on the behalf of Jarold Motto, PA,as directed by  Jarold Motto, PA while in the presence of Jarold Motto, [place].   ***  Jarold Motto, PA-C

## 2023-02-23 ENCOUNTER — Encounter: Payer: Self-pay | Admitting: Physician Assistant

## 2023-02-23 ENCOUNTER — Ambulatory Visit (INDEPENDENT_AMBULATORY_CARE_PROVIDER_SITE_OTHER): Payer: PRIVATE HEALTH INSURANCE | Admitting: Physician Assistant

## 2023-02-23 VITALS — BP 138/90 | HR 73 | Temp 97.8°F | Ht 64.0 in | Wt 213.0 lb

## 2023-02-23 DIAGNOSIS — R030 Elevated blood-pressure reading, without diagnosis of hypertension: Secondary | ICD-10-CM | POA: Diagnosis not present

## 2023-02-23 DIAGNOSIS — R051 Acute cough: Secondary | ICD-10-CM | POA: Diagnosis not present

## 2023-02-23 DIAGNOSIS — F4323 Adjustment disorder with mixed anxiety and depressed mood: Secondary | ICD-10-CM | POA: Diagnosis not present

## 2023-02-23 DIAGNOSIS — R42 Dizziness and giddiness: Secondary | ICD-10-CM | POA: Diagnosis not present

## 2023-02-23 LAB — CBC WITH DIFFERENTIAL/PLATELET
Basophils Absolute: 0.1 10*3/uL (ref 0.0–0.1)
Basophils Relative: 0.6 % (ref 0.0–3.0)
Eosinophils Absolute: 0.2 10*3/uL (ref 0.0–0.7)
Eosinophils Relative: 1.8 % (ref 0.0–5.0)
HCT: 40.6 % (ref 36.0–46.0)
Hemoglobin: 13.8 g/dL (ref 12.0–15.0)
Lymphocytes Relative: 19.5 % (ref 12.0–46.0)
Lymphs Abs: 2.1 10*3/uL (ref 0.7–4.0)
MCHC: 33.9 g/dL (ref 30.0–36.0)
MCV: 90.2 fL (ref 78.0–100.0)
Monocytes Absolute: 0.7 10*3/uL (ref 0.1–1.0)
Monocytes Relative: 6.1 % (ref 3.0–12.0)
Neutro Abs: 7.9 10*3/uL — ABNORMAL HIGH (ref 1.4–7.7)
Neutrophils Relative %: 72 % (ref 43.0–77.0)
Platelets: 283 10*3/uL (ref 150.0–400.0)
RBC: 4.5 Mil/uL (ref 3.87–5.11)
RDW: 13.4 % (ref 11.5–15.5)
WBC: 10.9 10*3/uL — ABNORMAL HIGH (ref 4.0–10.5)

## 2023-02-23 LAB — TSH: TSH: 2.15 u[IU]/mL (ref 0.35–5.50)

## 2023-02-23 LAB — COMPREHENSIVE METABOLIC PANEL
ALT: 22 U/L (ref 0–35)
AST: 16 U/L (ref 0–37)
Albumin: 4.1 g/dL (ref 3.5–5.2)
Alkaline Phosphatase: 72 U/L (ref 39–117)
BUN: 11 mg/dL (ref 6–23)
CO2: 27 meq/L (ref 19–32)
Calcium: 8.9 mg/dL (ref 8.4–10.5)
Chloride: 102 meq/L (ref 96–112)
Creatinine, Ser: 0.56 mg/dL (ref 0.40–1.20)
GFR: 116.46 mL/min (ref >60.00)
Glucose, Bld: 82 mg/dL (ref 70–99)
Potassium: 4 meq/L (ref 3.5–5.1)
Sodium: 138 meq/L (ref 135–145)
Total Bilirubin: 0.5 mg/dL (ref 0.2–1.2)
Total Protein: 7.1 g/dL (ref 6.0–8.3)

## 2023-02-23 LAB — LIPID PANEL
Cholesterol: 156 mg/dL (ref 0–200)
HDL: 43.3 mg/dL (ref >39.00)
LDL Cholesterol: 98 mg/dL (ref 0–99)
NonHDL: 113.14
Total CHOL/HDL Ratio: 4
Triglycerides: 78 mg/dL (ref 0.0–149.0)
VLDL: 15.6 mg/dL (ref 0.0–40.0)

## 2023-02-23 MED ORDER — AZITHROMYCIN 250 MG PO TABS: ORAL_TABLET | ORAL | 0 refills | Status: AC

## 2023-02-23 NOTE — Patient Instructions (Signed)
It was great to see you!  For your cough, start azithromycin  For your blood pressure -- take daily when at work -- message me in 1-2 weeks with averages and we may start low-dose blood pressure medication We will also update blood work   Keep me posted on your mental health and let me know if you need anything  For your dizziness, if returns, trial the "Epley" maneuver  If no success can take OTC (available over the counter without a prescription) dramamine If still no results, let me know and I will get you to my vestibular physical therapist  Let's follow-up in 3 months for Comprehensive Physical Exam (CPE) preventive care annual visit, sooner if you have concerns.  Take care,  Jarold Motto PA-C

## 2023-02-24 ENCOUNTER — Encounter: Payer: Self-pay | Admitting: Physician Assistant

## 2023-02-24 DIAGNOSIS — D72829 Elevated white blood cell count, unspecified: Secondary | ICD-10-CM

## 2023-05-25 ENCOUNTER — Encounter: Payer: Self-pay | Admitting: Physician Assistant

## 2023-05-25 ENCOUNTER — Ambulatory Visit (INDEPENDENT_AMBULATORY_CARE_PROVIDER_SITE_OTHER): Payer: PRIVATE HEALTH INSURANCE | Admitting: Physician Assistant

## 2023-05-25 VITALS — BP 124/80 | HR 76 | Temp 98.2°F | Ht 64.0 in | Wt 213.0 lb

## 2023-05-25 DIAGNOSIS — Z0000 Encounter for general adult medical examination without abnormal findings: Secondary | ICD-10-CM

## 2023-05-25 DIAGNOSIS — E669 Obesity, unspecified: Secondary | ICD-10-CM

## 2023-05-25 NOTE — Progress Notes (Signed)
 Subjective:    Elizabeth [name] is a 38 y.o. female and is here for a comprehensive physical exam.  HPI  There are no preventive care reminders to display for this patient.  Acute Concerns: None   Chronic Issues: Anxiety/Depression  Not interested in any medication at this time. No concerns or symptoms at this time. Denies any GI symptoms.   Health Maintenance: Immunizations -- N/A Colonoscopy -- N/A Mammogram -- N/A PAP -- last done on 05/10/19. Normal  Bone Density -- N/A Diet -- follows a typical diet.  Exercise -- no exercise   Sleep habits -- occasionally stays up late but has good sleep quality Mood -- stable  UTD with dentist? - yes UTD with eye doctor? - no  Weight history: Wt Readings from Last 10 Encounters:  05/25/23 213 lb (96.6 kg)  02/23/23 213 lb (96.6 kg)  12/31/21 210 lb (95.3 kg)  05/04/21 201 lb (91.2 kg)  08/20/20 194 lb (88 kg)  03/31/20 200 lb (90.7 kg)  04/07/18 211 lb (95.7 kg)  04/04/18 211 lb 8 oz (95.9 kg)  03/03/18 211 lb (95.7 kg)  01/17/18 213 lb (96.6 kg)   Body mass index is 36.56 kg/m. Patient's last menstrual period was 05/14/2023 (exact date).  Alcohol use:  reports current alcohol use.  Tobacco use:  Tobacco Use: Low Risk  (05/25/2023)   Patient History    Smoking Tobacco Use: Never    Smokeless Tobacco Use: Never    Passive Exposure: Not on file   Eligible for lung cancer screening? no     05/25/2023    8:49 AM  Depression screen PHQ 2/9  Decreased Interest 0  Down, Depressed, Hopeless 0  PHQ - 2 Score 0     Other providers/specialists: Patient Care Team: Jarold Motto, [place] as PCP - General (Physician Assistant)    PMHx, SurgHx, SocialHx, Medications, and Allergies were reviewed in the Visit Navigator and updated as appropriate.   Past Medical History:  Diagnosis Date   History of chicken pox      Past Surgical History:  Procedure Laterality Date   CHOLECYSTECTOMY  2008     Family  History  Problem Relation Age of Onset   High Cholesterol Mother    Other Mother        back pain, did have surgery   Arthritis Maternal Grandmother        rheumatoid   Asthma Maternal Grandmother    Depression Maternal Grandmother    Heart attack Maternal Grandmother    Heart disease Maternal Grandmother    High blood pressure Maternal Grandmother    Hypercholesterolemia Maternal Grandmother    Stroke Maternal Grandmother    Heart attack Maternal Grandfather    Colon cancer Neg Hx    Breast cancer Neg Hx     Social History   Tobacco Use   Smoking status: Never   Smokeless tobacco: Never  Vaping Use   Vaping status: Never Used  Substance Use Topics   Alcohol use: Yes    Comment: drinks occassionally    Drug use: No    Review of Systems:   Review of Systems  Constitutional:  Negative for chills, fever, malaise/fatigue and weight loss.  HENT:  Negative for hearing loss, sinus pain and sore throat.   Respiratory:  Negative for cough and hemoptysis.   Cardiovascular:  Negative for chest pain, palpitations, leg swelling and PND.  Gastrointestinal: Negative.  Negative for abdominal pain, constipation, diarrhea, heartburn, nausea and vomiting.  Genitourinary:  Negative for dysuria, frequency and urgency.  Musculoskeletal:  Negative for back pain, myalgias and neck pain.  Skin:  Negative for itching and rash.  Neurological:  Negative for dizziness, tingling, seizures and headaches.  Endo/Heme/Allergies:  Negative for polydipsia.  Psychiatric/Behavioral:  Negative for depression. The patient is not nervous/anxious.     Objective:   BP 124/80 (BP Location: Left Arm, Patient Position: Sitting, Cuff Size: Large)   Pulse 76   Temp 98.2 F (36.8 C) (Temporal)   Ht 5\' 4"  (1.626 m)   Wt 213 lb (96.6 kg)   LMP 05/14/2023 (Exact Date)   SpO2 98%   BMI 36.56 kg/m  Body mass index is 36.56 kg/m.   General Appearance:    Alert, cooperative, no distress, appears stated age   Head:    Normocephalic, without obvious abnormality, atraumatic  Eyes:    PERRL, conjunctiva/corneas clear, EOM's intact, fundi    benign, both eyes  Ears:    Normal TM's and external ear canals, both ears  Nose:   Nares normal, septum midline, mucosa normal, no drainage    or sinus tenderness  Throat:   Lips, mucosa, and tongue normal; teeth and gums normal  Neck:   Supple, symmetrical, trachea midline, no adenopathy;    thyroid:  no enlargement/tenderness/nodules; no carotid   bruit or JVD  Back:     Symmetric, no curvature, ROM normal, no CVA tenderness  Lungs:     Clear to auscultation bilaterally, respirations unlabored  Chest Wall:    No tenderness or deformity   Heart:    Regular rate and rhythm, S1 and S2 normal, no murmur, rub or gallop  Breast Exam:    Deferred  Abdomen:     Soft, non-tender, bowel sounds active all four quadrants,    no masses, no organomegaly  Genitalia:    Deferred  Extremities:   Extremities normal, atraumatic, no cyanosis or edema  Pulses:   2+ and symmetric all extremities  Skin:   Skin color, texture, turgor normal, no rashes or lesions  Lymph nodes:   Cervical, supraclavicular, and axillary nodes normal  Neurologic:   CNII-XII intact, normal strength, sensation and reflexes    throughout    Assessment/Plan:   Routine physical examination Today patient counseled on age appropriate routine health concerns for screening and prevention, each reviewed and up to date or declined. Immunizations reviewed and up to date or declined. Labs deferred -- were completed 4 month(s) ago and reviewed with patient. Risk factors for depression reviewed and negative. Hearing function and visual acuity are intact. ADLs screened and addressed as needed. Functional ability and level of safety reviewed and appropriate. Education, counseling and referrals performed based on assessed risks today. Patient provided with a copy of personalized plan for preventive  services.   Obesity without serious comorbidity, unspecified class, unspecified obesity type Continue efforts at healthy lifestyle   Jarold Motto, PA-C [facility] M Kadhim,acting as a Neurosurgeon for [employer], PA.,have documented all relevant documentation on the behalf of Jarold Motto, PA,as directed by  Jarold Motto, PA while in the presence of Jarold Motto, [place].   I, Jarold Motto, [place], have reviewed all documentation for this visit. The documentation on 05/25/23 for the exam, diagnosis, procedures, and orders are all accurate and complete.

## 2023-05-25 NOTE — Patient Instructions (Addendum)
It was great to see you!  Take care,  Ellerie Arenz   

## 2023-06-22 ENCOUNTER — Encounter: Payer: Self-pay | Admitting: Physician Assistant

## 2023-06-22 ENCOUNTER — Ambulatory Visit (INDEPENDENT_AMBULATORY_CARE_PROVIDER_SITE_OTHER): Payer: PRIVATE HEALTH INSURANCE | Admitting: Physician Assistant

## 2023-06-22 VITALS — BP 138/90 | HR 103 | Temp 98.6°F | Ht 64.0 in | Wt 215.2 lb

## 2023-06-22 DIAGNOSIS — R030 Elevated blood-pressure reading, without diagnosis of hypertension: Secondary | ICD-10-CM

## 2023-06-22 DIAGNOSIS — R051 Acute cough: Secondary | ICD-10-CM

## 2023-06-22 DIAGNOSIS — M542 Cervicalgia: Secondary | ICD-10-CM | POA: Diagnosis not present

## 2023-06-22 MED ORDER — PREDNISONE 20 MG PO TABS: 20.0000 mg | ORAL_TABLET | Freq: Two times a day (BID) | ORAL | 0 refills | Status: DC

## 2023-06-22 MED ORDER — CYCLOBENZAPRINE HCL 10 MG PO TABS: 10.0000 mg | ORAL_TABLET | Freq: Three times a day (TID) | ORAL | 0 refills | Status: AC | PRN

## 2023-06-22 NOTE — Progress Notes (Signed)
 Elizabeth [name] is a 38 y.o. female here for a new problem.  History of Present Illness:   Chief Complaint  Patient presents with   Cough    Pt c/o cough x 1.5 weeks, but is getting better.    Neck Pain    Pt c/o left side neck pain x 1 week, worse past 2 days, very still and hurts to move.     Cough  Patient complains of a cough that has persisted for the past 10 days.  Associated symptoms include headaches, congestion, and sputum production.  Symptoms have been improving since then.   Denies any fever or chills.   Neck pain  Patient complains of left-sided neck pain that has persisted for the past week.  Symptoms have been worsening over the past 2 days.   Pain is worsened with any rotation  States that she was unable to rotate her neck this morning, has to rotate her entire upper body.  Reports taking ibuprofen earlier this morning with minimal relief.  Denies recently changing pillows or mattresses.   Elevated BP  BP is 140/90 today Patient denies chest pain, SOB, blurred vision, dizziness, unusual headaches, lower leg swelling. Patient is compliant with medication. Denies excessive caffeine intake, stimulant usage, excessive alcohol intake, or increase in salt  Past Medical History:  Diagnosis Date   History of chicken pox      Social History   Tobacco Use   Smoking status: Never   Smokeless tobacco: Never  Vaping Use   Vaping status: Never Used  Substance Use Topics   Alcohol use: Yes    Comment: drinks occassionally    Drug use: No    Past Surgical History:  Procedure Laterality Date   CHOLECYSTECTOMY  2008    Family History  Problem Relation Age of Onset   High Cholesterol Mother    Other Mother        back pain, did have surgery   Arthritis Maternal Grandmother        rheumatoid   Asthma Maternal Grandmother    Depression Maternal Grandmother    Heart attack Maternal Grandmother    Heart disease Maternal Grandmother    High blood  pressure Maternal Grandmother    Hypercholesterolemia Maternal Grandmother    Stroke Maternal Grandmother    Heart attack Maternal Grandfather    Colon cancer Neg Hx    Breast cancer Neg Hx     Allergies  Allergen Reactions   Other Other (See Comments)    Kiwi Throat scratchy and started to close.   Sulfa Antibiotics Hives    Felt like something was in her throat and could not get it out.   Kiwi Extract Other (See Comments)    Current Medications:   Current Outpatient Medications:    cyclobenzaprine (FLEXERIL) 10 MG tablet, Take 1 tablet (10 mg total) by mouth 3 (three) times daily as needed for muscle spasms., Disp: 30 tablet, Rfl: 0   ibuprofen (ADVIL,MOTRIN) 200 MG tablet, Take 600 mg by mouth every 6 (six) hours as needed., Disp: , Rfl:    predniSONE (DELTASONE) 20 MG tablet, Take 1 tablet (20 mg total) by mouth 2 (two) times daily with a meal., Disp: 10 tablet, Rfl: 0   Review of Systems:   Review of Systems  HENT:  Positive for congestion.   Respiratory:  Positive for cough and sputum production.   Musculoskeletal:  Positive for neck pain.    Vitals:   Vitals:   06/22/23  0911 06/22/23 1035  BP: (!) 140/90 (!) 138/90  Pulse: (!) 103   Temp: 98.6 F (37 C)   TempSrc: Temporal   SpO2: 96%   Weight: 215 lb 4 oz (97.6 kg)   Height: 5\' 4"  (1.626 m)      Body mass index is 36.95 kg/m.  Physical Exam:   Physical Exam Vitals and nursing note reviewed.  Constitutional:      General: She is not in acute distress.    Appearance: She is well-developed. She is not ill-appearing or toxic-appearing.  Cardiovascular:     Rate and Rhythm: Normal rate and regular rhythm.     Pulses: Normal pulses.     Heart sounds: Normal heart sounds, S1 normal and S2 normal.  Pulmonary:     Effort: Pulmonary effort is normal.     Breath sounds: Normal breath sounds.  Musculoskeletal:     Comments: Able to freely move chin to chest without discomfort Pain elicited with rotation of  head Reproducible tenderness with deep palpation to left sternocleidomastoid muscle  No bony tenderness. No evidence of erythema, rash or ecchymosis.   Skin:    General: Skin is warm and dry.  Neurological:     Mental Status: She is alert.     GCS: GCS eye subscore is 4. GCS verbal subscore is 5. GCS motor subscore is 6.  Psychiatric:        Speech: Speech normal.        Behavior: Behavior normal. Behavior is cooperative.     Assessment and Plan:   Elevated blood pressure reading Above goal today No evidence of end-organ damage on my exam Recommend patient monitor home blood pressure at least a few times weekly If home monitoring shows consistent elevation, or any symptom(s) develop, recommend reach out to us  for further advice on next steps  Acute cough No red flags on exam.   Symptoms improving with supportive care  Reviewed return precautions including new or worsening fever, SOB, new or worsening cough or other concerns.  Push fluids and rest.  I recommend that patient follow-up if symptoms worsen or persist despite treatment x 7-10 days, sooner if needed.  Neck pain No red flags Suspect sternocleidomastoid muscle strain Trial prednisone (hold NSAIDs while taking) Trial muscle relaxers If no improvement, will refer to physical therapy or sports medicine based on symptom(s)    Alexander Iba, PA-C  I,Safa M Kadhim,acting as a scribe for Alexander Iba, PA.,have documented all relevant documentation on the behalf of Alexander Iba, PA,as directed by  Alexander Iba, PA while in the presence of Alexander Iba, [place].   I, Alexander Iba, [place], have reviewed all documentation for this visit. The documentation on 06/22/23 for the exam, diagnosis, procedures, and orders are all accurate and complete.

## 2023-06-22 NOTE — Patient Instructions (Signed)
 It was great to see you!  Start prednisone for your neck Avoid ibuprofen while taking this  Take flexeril as needed - -- start at night, may make you drowsy  Trial exercises  Your blood pressure is elevated in our office today.  I recommend that you monitor this at home.  Your goal blood pressure should be around < 130/80, unless you are over 38 years old, your goal may be closer to 140-150/90. Please note if you have been given other goals from a cardiologist or other healthcare provider, please defer to their recommendations.  When preparing to take your blood pressure: Plan ahead. Don't smoke, drink caffeine or exercise within 30 minutes before taking your blood pressure. Empty your bladder. Don't take the measurement over clothes. Remove the clothing over the arm that will be used to measure blood pressure. You can use either arm unless otherwise told by a healthcare provider. Usually there is not a big difference between readings on them. Be still. Allow at least five minutes of quiet rest before measurements. Don't talk or use the phone. Sit correctly. Sit with your back straight and supported (on a dining chair, rather than a sofa). Your feet should be flat on the floor. Do not cross your legs. Support your arm on a flat surface. The middle of the cuff should be placed on the upper arm at heart level.  Measure at the same time of the day. Take multiple readings and record the results. Each time you measure, take two readings one minute apart. Record the results and bring in to your next office visit.  In order to know how well the medication is working, I would like you to take your readings 1-2 hours after taking your blood pressure medication if possible. Take your blood pressure measurements and record 2-3 days per week.  If you get a high blood pressure reading: A single high reading is not an immediate cause for alarm. If you get a reading that is higher than normal, take your  blood pressure a second time. Write down the results of both measurements. Check with your health care professional to see if there's a health concern or whether there may be problems with your monitor. If your blood pressure readings are suddenly higher than 180/120 mm Hg, wait at least one minute and test again. If your readings are still very high, contact your health care professional immediately. You could be having a hypertensive crisis. Call 911 if your blood pressure is higher than 180/120 mm Hg and if you are having new signs or symptoms that may include: Chest pain Shortness of breath Back pain Numbness Weakness Change in vision Difficulty speaking Confusion Dizziness Vomiting       Let's follow-up if any worsening or no improvement, sooner if you have concerns.  Take care,  Alexander Iba PA-C

## 2023-07-03 ENCOUNTER — Encounter: Payer: Self-pay | Admitting: Physician Assistant

## 2023-07-04 ENCOUNTER — Other Ambulatory Visit: Payer: Self-pay | Admitting: Physician Assistant

## 2023-07-04 MED ORDER — AMLODIPINE BESYLATE 5 MG PO TABS: 5.0000 mg | ORAL_TABLET | Freq: Every day | ORAL | 1 refills | Status: DC

## 2023-07-04 NOTE — Addendum Note (Signed)
 Addended by: Winona Haw on: 07/04/2023 10:48 AM   Modules accepted: Orders

## 2023-07-13 ENCOUNTER — Encounter: Payer: Self-pay | Admitting: Physician Assistant

## 2023-08-26 ENCOUNTER — Encounter: Payer: Self-pay | Admitting: Physician Assistant

## 2023-09-05 ENCOUNTER — Encounter: Payer: Self-pay | Admitting: Physician Assistant

## 2023-09-05 ENCOUNTER — Ambulatory Visit (INDEPENDENT_AMBULATORY_CARE_PROVIDER_SITE_OTHER): Payer: PRIVATE HEALTH INSURANCE | Admitting: Physician Assistant

## 2023-09-05 VITALS — BP 118/80 | HR 93 | Temp 98.4°F | Ht 64.0 in | Wt 215.2 lb

## 2023-09-05 DIAGNOSIS — E669 Obesity, unspecified: Secondary | ICD-10-CM

## 2023-09-05 MED ORDER — ZEPBOUND 2.5 MG/0.5ML ~~LOC~~ SOAJ: 2.5000 mg | SUBCUTANEOUS | 0 refills | Status: DC

## 2023-09-05 NOTE — Progress Notes (Signed)
 Elizabeth [name] is a 38 y.o. female here for a follow up of a pre-existing problem.  History of Present Illness:   Chief Complaint  Patient presents with   Obesity    Pt is here to discuss starting GLP1.    HPI  Obesity Pt is currently not on any medication for weight management and would like to discuss starting GLP1. She states she has an end goal weight of 150 lb, but her first goal is to be under 200 pounds Endorses walking daily and working on a healthy diet. She is currently limiting her portions to help reduce her weight She has been trying to lose weight for over year.  Denies plans for pregnancy or family history of thyroid  cancer   Past Medical History:  Diagnosis Date   History of chicken pox      Social History   Tobacco Use   Smoking status: Never   Smokeless tobacco: Never  Vaping Use   Vaping status: Never Used  Substance Use Topics   Alcohol use: Yes    Comment: drinks occassionally    Drug use: No    Past Surgical History:  Procedure Laterality Date   CHOLECYSTECTOMY  2008    Family History  Problem Relation Age of Onset   High Cholesterol Mother    Other Mother        back pain, did have surgery   Arthritis Maternal Grandmother        rheumatoid   Asthma Maternal Grandmother    Depression Maternal Grandmother    Heart attack Maternal Grandmother    Heart disease Maternal Grandmother    High blood pressure Maternal Grandmother    Hypercholesterolemia Maternal Grandmother    Stroke Maternal Grandmother    Heart attack Maternal Grandfather    Colon cancer Neg Hx    Breast cancer Neg Hx     Allergies  Allergen Reactions   Other Other (See Comments)    Kiwi Throat scratchy and started to close.   Sulfa Antibiotics Hives    Felt like something was in her throat and could not get it out.   Kiwi Extract Other (See Comments)    Current Medications:   Current Outpatient Medications:    albuterol (VENTOLIN HFA) 108 (90 Base)  MCG/ACT inhaler, Inhale 2 puffs into the lungs every 6 (six) hours as needed., Disp: , Rfl:    amLODipine  (NORVASC ) 5 MG tablet, Take 1 tablet (5 mg total) by mouth daily., Disp: 90 tablet, Rfl: 1   cyclobenzaprine  (FLEXERIL ) 10 MG tablet, Take 1 tablet (10 mg total) by mouth 3 (three) times daily as needed for muscle spasms., Disp: 30 tablet, Rfl: 0   ibuprofen (ADVIL,MOTRIN) 200 MG tablet, Take 600 mg by mouth every 6 (six) hours as needed., Disp: , Rfl:    tirzepatide (ZEPBOUND) 2.5 MG/0.5ML Pen, Inject 2.5 mg into the skin once a week., Disp: 2 mL, Rfl: 0   Review of Systems:   Negative unless otherwise specified per HPI.  Vitals:   Vitals:   09/05/23 1109  BP: 118/80  Pulse: 93  Temp: 98.4 F (36.9 C)  TempSrc: Temporal  SpO2: 98%  Weight: 215 lb 4 oz (97.6 kg)  Height: 5' 4 (1.626 m)     Body mass index is 36.95 kg/m.  Physical Exam:   Physical Exam Vitals and nursing note reviewed.  Constitutional:      General: She is not in acute distress.    Appearance: She is well-developed.  She is not ill-appearing or toxic-appearing.   Cardiovascular:     Rate and Rhythm: Normal rate and regular rhythm.     Pulses: Normal pulses.     Heart sounds: Normal heart sounds, S1 normal and S2 normal.  Pulmonary:     Effort: Pulmonary effort is normal.     Breath sounds: Normal breath sounds.   Skin:    General: Skin is warm and dry.   Neurological:     Mental Status: She is alert.     GCS: GCS eye subscore is 4. GCS verbal subscore is 5. GCS motor subscore is 6.   Psychiatric:        Speech: Speech normal.        Behavior: Behavior normal. Behavior is cooperative.     Assessment and Plan:   1. Obesity without serious comorbidity, unspecified class, unspecified obesity type (Primary)  Patient is a good candidate for glp-1 Reviewed risks and benefits/side effect(s) of medication Will start Zepbound 2.5 mg weekly Continue efforts at healthy diet with reduced calories  and regular exercise Follow up in 3 month(s), sooner if concerns  I, Lavern Simmers, acting as a Neurosurgeon for [employer], [place]., have documented all relevant documentation on the behalf of Lucie Buttner, [place], as directed by Lucie Buttner, PA while in the presence of Lucie Buttner, [place].  I, Lucie Buttner, [place], have reviewed all documentation for this visit. The documentation on 09/05/23 for the exam, diagnosis, procedures, and orders are all accurate and complete.  Lucie Buttner, PA-C

## 2023-09-29 ENCOUNTER — Other Ambulatory Visit: Payer: Self-pay | Admitting: Physician Assistant

## 2023-09-29 MED ORDER — ZEPBOUND 5 MG/0.5ML ~~LOC~~ SOAJ: 5.0000 mg | SUBCUTANEOUS | 0 refills | Status: DC

## 2023-10-04 ENCOUNTER — Encounter: Payer: Self-pay | Admitting: Physician Assistant

## 2023-10-27 ENCOUNTER — Other Ambulatory Visit: Payer: Self-pay | Admitting: Physician Assistant

## 2023-11-24 ENCOUNTER — Other Ambulatory Visit: Payer: Self-pay | Admitting: Physician Assistant

## 2023-12-05 ENCOUNTER — Encounter: Payer: Self-pay | Admitting: Physician Assistant

## 2023-12-05 ENCOUNTER — Telehealth: Payer: PRIVATE HEALTH INSURANCE | Admitting: Physician Assistant

## 2023-12-05 VITALS — Ht 64.0 in | Wt 191.2 lb

## 2023-12-05 DIAGNOSIS — E669 Obesity, unspecified: Secondary | ICD-10-CM

## 2023-12-05 NOTE — Progress Notes (Signed)
 Virtual Visit via Video Note   I, Lucie Buttner, connected with  Elizabeth [name]  ([id number], Jan 22, 1986) on 12/05/23 at  8:20 AM EDT by a video-enabled telemedicine application and verified that I am speaking with the correct person using two identifiers.  Location: Patient: Home Provider: [facility] [place] office   I discussed the limitations of evaluation and management by telemedicine and the availability of in person appointments. The patient expressed understanding and agreed to proceed.    History of Present Illness: Elizabeth [name] is a 38 y.o. who identifies as a female who was assigned female at birth, and is being seen today for obesity management   She is currently on Zepbound  5 mg weekly.  Since June, she has lost significant weight, decreasing from 215 pounds to current weight of 191 pounds, with a goal of reaching 150 pounds. Her weight loss has slowed, with fluctuations after dining out, but she continues to lose approximately a pound weekly.  She takes medication every Thursday morning. The following day, she experiences increased hot flashes and sweating, similar to her perimenopausal symptoms prior to medication. She also experiences a lack of appetite the day after taking the medication.  A heightened gag reflex occurs, especially when brushing her teeth, but vomiting is rare. Constipation is managed with Miralax over several days.   She continues efforts to be very active and has an active job  She is working on [employer] and limiting fast foods         Problems:  Patient Active Problem List   Diagnosis Date Noted   Obesity 05/04/2021   Stress incontinence (female) (female) 05/04/2021   Migraine 05/04/2021   Major depressive disorder, single episode, unspecified 05/04/2021   Irregular periods 05/04/2021    Allergies:  Allergies  Allergen Reactions   Other Other (See Comments)    Kiwi Throat scratchy and started  to close.   Sulfa Antibiotics Hives    Felt like something was in her throat and could not get it out.   Kiwi Extract Other (See Comments)   Medications:  Current Outpatient Medications:    albuterol (VENTOLIN HFA) 108 (90 Base) MCG/ACT inhaler, Inhale 2 puffs into the lungs every 6 (six) hours as needed., Disp: , Rfl:    amLODipine  (NORVASC ) 5 MG tablet, Take 1 tablet (5 mg total) by mouth daily., Disp: 90 tablet, Rfl: 1   cyclobenzaprine  (FLEXERIL ) 10 MG tablet, Take 1 tablet (10 mg total) by mouth 3 (three) times daily as needed for muscle spasms., Disp: 30 tablet, Rfl: 0   etonogestrel (NEXPLANON) 68 MG IMPL implant, 1 each by Subdermal route once., Disp: , Rfl:    ibuprofen (ADVIL,MOTRIN) 200 MG tablet, Take 600 mg by mouth every 6 (six) hours as needed., Disp: , Rfl:    ZEPBOUND  5 MG/0.5ML Pen, Inject 5 mg into the skin once a week., Disp: 2 mL, Rfl: 0  Observations/Objective: Patient is well-developed, well-nourished in no acute distress.  Resting comfortably  at home.  Head is normocephalic, atraumatic.  No labored breathing.  Speech is clear and coherent with logical content.  Patient is alert and oriented at baseline.   Assessment and Plan: Assessment and Plan    Obesity Weight loss ongoing with current medication. Hot flashes likely perimenopausal. Increased sensitivity and gag reflex noted. Appetite decrease post-medication. Goal weight 150 lbs. Monitor for hypotension due to potential need for amlodipine  adjustment. - Continue medication at 5 mg while weight loss persists. -  Monitor for hypotension symptoms. - Contact provider if weight loss plateaus to discuss dosage adjustment.  Follow up in 3 month(s), sooner if concerns    Follow Up Instructions: I discussed the assessment and treatment plan with the patient. The patient was provided an opportunity to ask questions and all were answered. The patient agreed with the plan and demonstrated an understanding of the  instructions.  A copy of instructions were sent to the patient via MyChart unless otherwise noted below.   The patient was advised to call back or seek an in-person evaluation if the symptoms worsen or if the condition fails to improve as anticipated.  Lucie Buttner, [place]

## 2023-12-22 ENCOUNTER — Other Ambulatory Visit: Payer: Self-pay | Admitting: Physician Assistant

## 2023-12-29 ENCOUNTER — Encounter: Payer: Self-pay | Admitting: Physician Assistant

## 2024-01-02 ENCOUNTER — Encounter: Payer: Self-pay | Admitting: Physician Assistant

## 2024-01-19 ENCOUNTER — Other Ambulatory Visit: Payer: Self-pay | Admitting: *Deleted

## 2024-01-19 MED ORDER — AMLODIPINE BESYLATE 5 MG PO TABS: 5.0000 mg | ORAL_TABLET | Freq: Every day | ORAL | 1 refills | Status: AC

## 2024-01-19 MED ORDER — ZEPBOUND 7.5 MG/0.5ML ~~LOC~~ SOAJ: 7.5000 mg | SUBCUTANEOUS | 0 refills | Status: DC

## 2024-02-16 ENCOUNTER — Other Ambulatory Visit: Payer: Self-pay | Admitting: Physician Assistant

## 2024-03-15 ENCOUNTER — Other Ambulatory Visit: Payer: Self-pay | Admitting: Physician Assistant

## 2024-03-26 ENCOUNTER — Ambulatory Visit (INDEPENDENT_AMBULATORY_CARE_PROVIDER_SITE_OTHER): Payer: PRIVATE HEALTH INSURANCE | Admitting: Physician Assistant

## 2024-03-26 VITALS — BP 122/74 | HR 96 | Temp 98.8°F | Ht 64.0 in | Wt 178.4 lb

## 2024-03-26 DIAGNOSIS — E669 Obesity, unspecified: Secondary | ICD-10-CM | POA: Diagnosis not present

## 2024-03-26 DIAGNOSIS — I10 Essential (primary) hypertension: Secondary | ICD-10-CM | POA: Diagnosis not present

## 2024-03-26 NOTE — Progress Notes (Signed)
 "  History of Present Illness:   Chief Complaint  Patient presents with   Obesity    Pt here for f/u Zepbound  7.5 mg, tolerating well.    Discussed the use of AI scribe software for clinical note transcription with the patient, who gave verbal consent to proceed.  History of Present Illness   Elizabeth [name] is a 39 year old female who presents with concerns about weight management and medication effects.  She has tracked weekly weights since starting a new medication dose on November 20, when she weighed 184 lb. Her weight has fluctuated and is 175.8 lb today. She has been walking less due to cold weather.  She notes early satiety and occasional nausea if she eats more. She administers injections in the abdomen, which she finds easier than in the arm, where the injection stings more.  She has occasional constipation and uses Miralax as needed.  She had one episode of dizziness at work a few weeks ago that resolved after eating lunch. Blood pressure and blood sugar at that time were normal. She denies other recent lightheadedness or orthostatic symptoms.        Past Medical History:  Diagnosis Date   History of chicken pox      Social History[1]  Past Surgical History:  Procedure Laterality Date   CHOLECYSTECTOMY  2008    Family History  Problem Relation Age of Onset   High Cholesterol Mother    Other Mother        back pain, did have surgery   Arthritis Maternal Grandmother        rheumatoid   Asthma Maternal Grandmother    Depression Maternal Grandmother    Heart attack Maternal Grandmother    Heart disease Maternal Grandmother    High blood pressure Maternal Grandmother    Hypercholesterolemia Maternal Grandmother    Stroke Maternal Grandmother    Heart attack Maternal Grandfather    Colon cancer Neg Hx    Breast cancer Neg Hx     Allergies[2]  Current Medications:  Current Medications[3]   Review of Systems:   Negative unless otherwise  specified per HPI.  Vitals:   Vitals:   03/26/24 1327  BP: 122/74  Pulse: 96  Temp: 98.8 F (37.1 C)  TempSrc: Temporal  SpO2: 98%  Weight: 178 lb 6.1 oz (80.9 kg)  Height: 5' 4 (1.626 m)     Body mass index is 30.62 kg/m.  Physical Exam:   Physical Exam Vitals and nursing note reviewed.  Constitutional:      General: She is not in acute distress.    Appearance: She is well-developed. She is not ill-appearing or toxic-appearing.  Cardiovascular:     Rate and Rhythm: Normal rate and regular rhythm.     Pulses: Normal pulses.     Heart sounds: Normal heart sounds, S1 normal and S2 normal.  Pulmonary:     Effort: Pulmonary effort is normal.     Breath sounds: Normal breath sounds.  Skin:    General: Skin is warm and dry.  Neurological:     Mental Status: She is alert.     GCS: GCS eye subscore is 4. GCS verbal subscore is 5. GCS motor subscore is 6.  Psychiatric:        Speech: Speech normal.        Behavior: Behavior normal. Behavior is cooperative.     Assessment and Plan:   Assessment and Plan    Obesity without serious comorbidity,  unspecified class, unspecified obesity type  Weight trending down with current medication. Considering dose increase if weight loss plateaus. No significant side effects reported with weight loss medication. Insurance covers medication with copay card. - Continue weight loss medication at 7.5 mg. - Consider increasing to 10 mg if weight loss plateaus. - Encouraged increased physical activity as weather permits.  Follow up in 3 month(s), sooner if concerns  Primary hypertension  Monitoring needed as weight loss may affect medication needs. No recent orthostatic symptoms. One dizziness episode possibly related to medication timing. - Monitor blood pressure and symptoms as weight loss progresses. - Continue amlodipine  5 mg daily   Avery Eustice, PA-C    [1]  Social History Tobacco Use   Smoking status: Never   Smokeless  tobacco: Never  Vaping Use   Vaping status: Never Used  Substance Use Topics   Alcohol use: Yes    Comment: drinks occassionally    Drug use: No  [2]  Allergies Allergen Reactions   Other Other (See Comments)    Kiwi Throat scratchy and started to close.   Sulfa Antibiotics Hives    Felt like something was in her throat and could not get it out.   Kiwi Extract Other (See Comments)  [3]  Current Outpatient Medications:    albuterol (VENTOLIN HFA) 108 (90 Base) MCG/ACT inhaler, Inhale 2 puffs into the lungs every 6 (six) hours as needed., Disp: , Rfl:    amLODipine  (NORVASC ) 5 MG tablet, Take 1 tablet (5 mg total) by mouth daily., Disp: 90 tablet, Rfl: 1   cyclobenzaprine  (FLEXERIL ) 10 MG tablet, Take 1 tablet (10 mg total) by mouth 3 (three) times daily as needed for muscle spasms., Disp: 30 tablet, Rfl: 0   etonogestrel (NEXPLANON) 68 MG IMPL implant, 1 each by Subdermal route once., Disp: , Rfl:    ibuprofen (ADVIL,MOTRIN) 200 MG tablet, Take 600 mg by mouth every 6 (six) hours as needed., Disp: , Rfl:    ZEPBOUND  7.5 MG/0.5ML Pen, Inject 0.5 mL (7.5 mg total) under the skin once a week., Disp: 2 mL, Rfl: 0  "

## 2024-03-26 NOTE — Patient Instructions (Addendum)
 Elizabeth [name]

## 2024-04-12 ENCOUNTER — Other Ambulatory Visit: Payer: Self-pay | Admitting: Physician Assistant

## 2024-05-30 ENCOUNTER — Encounter: Payer: PRIVATE HEALTH INSURANCE | Admitting: Physician Assistant
